# Patient Record
Sex: Male | Born: 1935
Health system: Southern US, Community
[De-identification: ages and names within clinical notes are randomized; demographics above are authoritative.]

## PROBLEM LIST (undated history)

## (undated) DIAGNOSIS — I10 Essential (primary) hypertension: Secondary | ICD-10-CM

## (undated) DIAGNOSIS — B54 Unspecified malaria: Secondary | ICD-10-CM

## (undated) DIAGNOSIS — R011 Cardiac murmur, unspecified: Secondary | ICD-10-CM

## (undated) DIAGNOSIS — I639 Cerebral infarction, unspecified: Secondary | ICD-10-CM

## (undated) DIAGNOSIS — Z87891 Personal history of nicotine dependence: Secondary | ICD-10-CM

## (undated) DIAGNOSIS — I4891 Unspecified atrial fibrillation: Secondary | ICD-10-CM

## (undated) DIAGNOSIS — Z8249 Family history of ischemic heart disease and other diseases of the circulatory system: Secondary | ICD-10-CM

## (undated) DIAGNOSIS — Z227 Latent tuberculosis: Secondary | ICD-10-CM

## (undated) DIAGNOSIS — M1990 Unspecified osteoarthritis, unspecified site: Secondary | ICD-10-CM

## (undated) HISTORY — DX: Unspecified osteoarthritis, unspecified site: M19.90

## (undated) HISTORY — PX: FRACTURE SURGERY: SHX138

## (undated) HISTORY — DX: Latent tuberculosis: Z22.7

## (undated) HISTORY — DX: Personal history of nicotine dependence: Z87.891

## (undated) HISTORY — DX: Essential (primary) hypertension: I10

## (undated) HISTORY — DX: Unspecified atrial fibrillation: I48.91

## (undated) HISTORY — DX: Unspecified malaria: B54

## (undated) HISTORY — DX: Cerebral infarction, unspecified: I63.9

## (undated) HISTORY — DX: Family history of ischemic heart disease and other diseases of the circulatory system: Z82.49

## (undated) HISTORY — DX: Cardiac murmur, unspecified: R01.1

---

## 1993-07-09 HISTORY — PX: LUMBAR FUSION: SHX111

## 2015-07-10 HISTORY — PX: OTHER SURGICAL HISTORY: SHX169

## 2016-03-09 DIAGNOSIS — I129 Hypertensive chronic kidney disease with stage 1 through stage 4 chronic kidney disease, or unspecified chronic kidney disease: Secondary | ICD-10-CM | POA: Insufficient documentation

## 2016-03-09 DIAGNOSIS — L409 Psoriasis, unspecified: Secondary | ICD-10-CM | POA: Insufficient documentation

## 2016-03-09 DIAGNOSIS — L4050 Arthropathic psoriasis, unspecified: Secondary | ICD-10-CM | POA: Insufficient documentation

## 2016-03-09 DIAGNOSIS — I10 Essential (primary) hypertension: Secondary | ICD-10-CM | POA: Insufficient documentation

## 2016-03-09 DIAGNOSIS — M170 Bilateral primary osteoarthritis of knee: Secondary | ICD-10-CM | POA: Insufficient documentation

## 2016-06-12 HISTORY — PX: COLONOSCOPY: SHX174

## 2016-10-04 ENCOUNTER — Encounter: Payer: Self-pay | Admitting: Family Medicine

## 2016-10-04 ENCOUNTER — Ambulatory Visit (INDEPENDENT_AMBULATORY_CARE_PROVIDER_SITE_OTHER): Payer: Medicare Other | Admitting: Family Medicine

## 2016-10-04 VITALS — BP 118/76 | HR 59 | Resp 16 | Ht 73.0 in | Wt 252.0 lb

## 2016-10-04 DIAGNOSIS — Z8249 Family history of ischemic heart disease and other diseases of the circulatory system: Secondary | ICD-10-CM | POA: Diagnosis not present

## 2016-10-04 DIAGNOSIS — I482 Chronic atrial fibrillation, unspecified: Secondary | ICD-10-CM | POA: Insufficient documentation

## 2016-10-04 DIAGNOSIS — Z23 Encounter for immunization: Secondary | ICD-10-CM

## 2016-10-04 DIAGNOSIS — I739 Peripheral vascular disease, unspecified: Secondary | ICD-10-CM

## 2016-10-04 DIAGNOSIS — I10 Essential (primary) hypertension: Secondary | ICD-10-CM | POA: Diagnosis not present

## 2016-10-04 DIAGNOSIS — I480 Paroxysmal atrial fibrillation: Secondary | ICD-10-CM

## 2016-10-04 DIAGNOSIS — L409 Psoriasis, unspecified: Secondary | ICD-10-CM

## 2016-10-04 HISTORY — DX: Family history of ischemic heart disease and other diseases of the circulatory system: Z82.49

## 2016-10-04 MED ORDER — ZOSTER VAC RECOMB ADJUVANTED 50 MCG/0.5ML IM SUSR: 0.5000 mL | Freq: Once | INTRAMUSCULAR | 1 refills | Status: AC

## 2016-10-04 MED ORDER — OLMESARTAN MEDOXOMIL 40 MG PO TABS: 40.0000 mg | ORAL_TABLET | Freq: Every day | ORAL | 3 refills | Status: DC

## 2016-10-04 MED ORDER — METOPROLOL SUCCINATE ER 100 MG PO TB24: 100.0000 mg | ORAL_TABLET | Freq: Every day | ORAL | 3 refills | Status: DC

## 2016-10-04 NOTE — Patient Instructions (Signed)
Tdap Vaccine (Tetanus, Diphtheria and Pertussis): What You Need to Know 1. Why get vaccinated? Tetanus, diphtheria and pertussis are very serious diseases. Tdap vaccine can protect us from these diseases. And, Tdap vaccine given to pregnant women can protect newborn babies against pertussis. TETANUS (Lockjaw) is rare in the [place] today. It causes painful muscle tightening and stiffness, usually all over the body.  It can lead to tightening of muscles in the head and neck so you can't open your mouth, swallow, or sometimes even breathe. Tetanus kills about 1 out of 10 people who are infected even after receiving the best medical care.  DIPHTHERIA is also rare in the [place] today. It can cause a thick coating to form in the back of the throat.  It can lead to breathing problems, heart failure, paralysis, and death.  PERTUSSIS (Whooping Cough) causes severe coughing spells, which can cause difficulty breathing, vomiting and disturbed sleep.  It can also lead to weight loss, incontinence, and rib fractures. Up to 2 in 100 adolescents and 5 in 100 adults with pertussis are hospitalized or have complications, which could include pneumonia or death.  These diseases are caused by bacteria. Diphtheria and pertussis are spread from person to person through secretions from coughing or sneezing. Tetanus enters the body through cuts, scratches, or wounds. Before vaccines, as many as 200,000 cases of diphtheria, 200,000 cases of pertussis, and hundreds of cases of tetanus, were reported in the [place] each year. Since vaccination began, reports of cases for tetanus and diphtheria have dropped by about 99% and for pertussis by about 80%. 2. Tdap vaccine Tdap vaccine can protect adolescents and adults from tetanus, diphtheria, and pertussis. One dose of Tdap is routinely given at age 11 or 12. People who did not get Tdap at that age should get it as soon as possible. Tdap is especially  important for healthcare professionals and anyone having close contact with a baby younger than 12 months. Pregnant women should get a dose of Tdap during every pregnancy, to protect the newborn from pertussis. Infants are most at risk for severe, life-threatening complications from pertussis. Another vaccine, called Td, protects against tetanus and diphtheria, but not pertussis. A Td booster should be given every 10 years. Tdap may be given as one of these boosters if you have never gotten Tdap before. Tdap may also be given after a severe cut or burn to prevent tetanus infection. Your doctor or the person giving you the vaccine can give you more information. Tdap may safely be given at the same time as other vaccines. 3. Some people should not get this vaccine  A person who has ever had a life-threatening allergic reaction after a previous dose of any diphtheria, tetanus or pertussis containing vaccine, OR has a severe allergy to any part of this vaccine, should not get Tdap vaccine. Tell the person giving the vaccine about any severe allergies.  Anyone who had coma or long repeated seizures within 7 days after a childhood dose of DTP or DTaP, or a previous dose of Tdap, should not get Tdap, unless a cause other than the vaccine was found. They can still get Td.  Talk to your doctor if you: ? have seizures or another nervous system problem, ? had severe pain or swelling after any vaccine containing diphtheria, tetanus or pertussis, ? ever had a condition called Guillain-Barr Syndrome (GBS), ? aren't feeling well on the day the shot is scheduled. 4. Risks With any medicine, including   vaccines, there is a chance of side effects. These are usually mild and go away on their own. Serious reactions are also possible but are rare. Most people who get Tdap vaccine do not have any problems with it. Mild problems following Tdap: (Did not interfere with activities)  Pain where the shot was given (about  3 in 4 adolescents or 2 in 3 adults)  Redness or swelling where the shot was given (about 1 person in 5)  Mild fever of at least 100.4F (up to about 1 in 25 adolescents or 1 in 100 adults)  Headache (about 3 or 4 people in 10)  Tiredness (about 1 person in 3 or 4)  Nausea, vomiting, diarrhea, stomach ache (up to 1 in 4 adolescents or 1 in 10 adults)  Chills, sore joints (about 1 person in 10)  Body aches (about 1 person in 3 or 4)  Rash, swollen glands (uncommon)  Moderate problems following Tdap: (Interfered with activities, but did not require medical attention)  Pain where the shot was given (up to 1 in 5 or 6)  Redness or swelling where the shot was given (up to about 1 in 16 adolescents or 1 in 12 adults)  Fever over 102F (about 1 in 100 adolescents or 1 in 250 adults)  Headache (about 1 in 7 adolescents or 1 in 10 adults)  Nausea, vomiting, diarrhea, stomach ache (up to 1 or 3 people in 100)  Swelling of the entire arm where the shot was given (up to about 1 in 500).  Severe problems following Tdap: (Unable to perform usual activities; required medical attention)  Swelling, severe pain, bleeding and redness in the arm where the shot was given (rare).  Problems that could happen after any vaccine:  People sometimes faint after a medical procedure, including vaccination. Sitting or lying down for about 15 minutes can help prevent fainting, and injuries caused by a fall. Tell your doctor if you feel dizzy, or have vision changes or ringing in the ears.  Some people get severe pain in the shoulder and have difficulty moving the arm where a shot was given. This happens very rarely.  Any medication can cause a severe allergic reaction. Such reactions from a vaccine are very rare, estimated at fewer than 1 in a million doses, and would happen within a few minutes to a few hours after the vaccination. As with any medicine, there is a very remote chance of a vaccine  causing a serious injury or death. The safety of vaccines is always being monitored. For more information, visit: www.cdc.gov/vaccinesafety/ 5. What if there is a serious problem? What should I look for? Look for anything that concerns you, such as signs of a severe allergic reaction, very high fever, or unusual behavior. Signs of a severe allergic reaction can include hives, swelling of the face and throat, difficulty breathing, a fast heartbeat, dizziness, and weakness. These would usually start a few minutes to a few hours after the vaccination. What should I do?  If you think it is a severe allergic reaction or other emergency that can't wait, call 9-1-1 or get the person to the nearest hospital. Otherwise, call your doctor.  Afterward, the reaction should be reported to the Vaccine Adverse Event Reporting System (VAERS). Your doctor might file this report, or you can do it yourself through the VAERS web site at www.vaers.hhs.gov, or by calling 1-800-822-7967. ? VAERS does not give medical advice. 6. The National Vaccine Injury Compensation Program The National   Vaccine Injury Compensation Program (VICP) is a federal program that was created to compensate people who may have been injured by certain vaccines. Persons who believe they may have been injured by a vaccine can learn about the program and about filing a claim by calling 1-800-338-2382 or visiting the VICP website at www.hrsa.gov/vaccinecompensation. There is a time limit to file a claim for compensation. 7. How can I learn more?  Ask your doctor. He or she can give you the vaccine package insert or suggest other sources of information.  Call your local or state health department.  Contact the Centers for Disease Control and Prevention (CDC): ? Call 1-800-232-4636 (1-800-CDC-INFO) or ? Visit CDC's website at www.cdc.gov/vaccines CDC Tdap Vaccine VIS (09/01/13) This information is not intended to replace advice given to you by your  health care provider. Make sure you discuss any questions you have with your health care provider. Document Released: 12/25/2011 Document Revised: 03/15/2016 Document Reviewed: 03/15/2016 Elsevier Interactive Patient Education  2017 Elsevier Inc.  

## 2016-10-04 NOTE — Progress Notes (Signed)
Date:  10/04/2016   Name:  Daniel Brandt   DOB:  01-Jan-1934   MRN:  [id number]  PCP:  Adline Potter, MD    Chief Complaint: Establish Care and Mass (LEFT HAND 4 DIGIT )   History of Present Illness:  This is a 83 y.o. male seen for initial visit. C/o cyst L 4th finger x 3 months, nonpainful, hx ganglion cysts. S/p B TKR, L one year ago, fish oil helps OA pain. Hx AFib on metoprolol/asa, declines warfarin, not interested in seeing cards.Marland Kitchen HTN on metoprolol/Benicar, well controlled in past. Hx plaque psoriasis, uses clobetasol sparingly. Hx PVD with RLE chronic venous stasis, uses compression stockings prn. Lipids low in past. Father died 42 CHF/CAD, mother died 35 CHF, sister with CHF, sister bipolar, sister with RA. Tetanus status unknown, Prevnar 03/2016, thinks zoster imm years ago. Labs 03/09/16 showed normal chemistries, LDL 84.  Review of Systems:  Review of Systems  Constitutional: Negative for chills and fever.  HENT: Negative for ear pain, sinus pain and trouble swallowing.   Eyes: Negative for pain.  Respiratory: Negative for cough and shortness of breath.   Cardiovascular: Negative for chest pain, palpitations and leg swelling.  Gastrointestinal: Negative for abdominal pain.  Endocrine: Negative for polydipsia and polyuria.  Genitourinary: Negative for difficulty urinating.  Neurological: Negative for syncope and light-headedness.    Patient Active Problem List   Diagnosis Date Noted  . Atrial fibrillation ([place]) 10/04/2016  . PVD (peripheral vascular disease) ([place]) 10/04/2016  . FH: heart failure 10/04/2016  . Essential hypertension 03/09/2016  . Osteoarthritis of both knees 03/09/2016  . Psoriasis 03/09/2016    Prior to Admission medications   Medication Sig Start Date End Date Taking? Authorizing Provider  aspirin (GOODSENSE ASPIRIN) 325 MG tablet Take 325 mg by mouth.   Yes Historical Provider, MD  clobetasol ointment (TEMOVATE) 0.05 % Apply topically. 04/23/16  04/23/17 Yes Historical Provider, MD  olmesartan (BENICAR) 40 MG tablet Take 1 tablet (40 mg total) by mouth daily. 10/04/16  Yes Adline Potter, MD  metoprolol succinate (TOPROL-XL) 100 MG 24 hr tablet Take 1 tablet (100 mg total) by mouth daily. Take with or immediately following a meal. 10/04/16   Adline Potter, MD  Zoster Vac Recomb Adjuvanted [facility]) injection Inject 0.5 mLs into the muscle once. 10/04/16 10/04/16  Adline Potter, MD    No Known Allergies  Past Surgical History:  Procedure Laterality Date  . FRACTURE SURGERY    . KNEE REPLACE Bilateral 2017  . LUMBAR FUSION  1995   L3 L4    Social History  Substance Use Topics  . Smoking status: Former Smoker    Packs/day: 1.00    Types: Cigarettes    Quit date: 10/04/1956  . Smokeless tobacco: Never Used  . Alcohol use 1.2 oz/week    2 Shots of liquor per week     Comment: 65 ML DAY OF SCOTCH    Family History  Problem Relation Age of Onset  . Heart disease Mother   . Heart disease Father   . Gout Sister     Medication list has been reviewed and updated.  Physical Examination: BP 118/76   Pulse (!) 59   Resp 16   Ht 6\' 1"  (1.854 m)   Wt 252 lb (114.3 kg)   SpO2 98%   BMI 33.25 kg/m   Physical Exam  Constitutional: He is oriented to person, place, and time. He appears well-developed and well-nourished.  HENT:  Head: Normocephalic and atraumatic.  Right Ear: External ear normal.  Left Ear: External ear normal.  Nose: Nose normal.  Mouth/Throat: Oropharynx is clear and moist.  TMs clear  Eyes: Conjunctivae and EOM are normal. Pupils are equal, round, and reactive to light.  Neck: Neck supple. No thyromegaly present.  Cardiovascular: Normal rate, regular rhythm, normal heart sounds and intact distal pulses.   Mild CVS changes RLE  Pulmonary/Chest: Effort normal and breath sounds normal.  Abdominal: Soft. He exhibits no distension and no mass. There is no tenderness.  Musculoskeletal:  Trace edema RLE   Lymphadenopathy:    He has no cervical adenopathy.  Neurological: He is alert and oriented to person, place, and time. Coordination normal.  Romberg neg, gait normal  Skin: Skin is warm and dry.  Psychiatric: He has a normal mood and affect. His behavior is normal.  Nursing note and vitals reviewed.   Assessment and Plan:  1. Paroxysmal atrial fibrillation (HCC) Well controlled on metoprolol/asa, declines warfarin, change metoprolol to XL 100 mg daily, consider decreasing asa to 81 mg daily next visit  2. Essential hypertension Well controlled on metoprolol/Benicar, refill Benicar, may be able to d/c in future  3. PVD (peripheral vascular disease) (HCC) Stable, cont prn compression stocking  4. Psoriasis Well controlled on prn clobetasol  5. FH: heart failure  6. Need for diphtheria-tetanus-pertussis (Tdap) vaccine - Tdap vaccine greater than or equal to 7yo IM  7. Need for zoster vaccination - Zoster Vac Recomb Adjuvanted [facility]) injection; Inject 0.5 mLs into the muscle once.  Dispense: 0.5 mL; Refill: 1  8. Med review Consider d/c vit E next visit (states fish oil helps OA pain)  Return in about 6 months (around 04/06/2017).   45 mins spent with patient, over half in counseling.  [name]. [facility]  10/04/2016

## 2016-11-27 ENCOUNTER — Ambulatory Visit (INDEPENDENT_AMBULATORY_CARE_PROVIDER_SITE_OTHER): Payer: Medicare Other | Admitting: Family Medicine

## 2016-11-27 ENCOUNTER — Encounter: Payer: Self-pay | Admitting: Family Medicine

## 2016-11-27 VITALS — BP 126/82 | HR 62 | Resp 16 | Ht 73.0 in | Wt 250.0 lb

## 2016-11-27 DIAGNOSIS — I10 Essential (primary) hypertension: Secondary | ICD-10-CM

## 2016-11-27 DIAGNOSIS — I739 Peripheral vascular disease, unspecified: Secondary | ICD-10-CM

## 2016-11-27 DIAGNOSIS — M25842 Other specified joint disorders, left hand: Secondary | ICD-10-CM

## 2016-11-27 DIAGNOSIS — I480 Paroxysmal atrial fibrillation: Secondary | ICD-10-CM | POA: Diagnosis not present

## 2016-11-27 MED ORDER — ASPIRIN 81 MG PO TABS: 81.0000 mg | ORAL_TABLET | Freq: Every day | ORAL | Status: AC

## 2016-11-28 NOTE — Progress Notes (Signed)
Date:  11/27/2016   Name:  Daniel Brandt   DOB:  1934-05-31   MRN:  [id number]  PCP:  Adline Potter, MD    Chief Complaint: Mass (On finger left hand fourth digit much bigger this visit than last )   History of Present Illness:  This is a 83 y.o. male seen for one month f/u. Concerned re: mass over PIP L 4th finger getting larger and interfering with hand use. PAfib sxs stable on metoprolol/asa, declines anticoagulants. Continues to use compression stockings for PVD.   Review of Systems:  Review of Systems  Constitutional: Negative for chills, fatigue and fever.  Respiratory: Negative for cough and shortness of breath.   Cardiovascular: Negative for chest pain and palpitations.  Genitourinary: Negative for difficulty urinating.  Neurological: Negative for syncope and light-headedness.    Patient Active Problem List   Diagnosis Date Noted  . Atrial fibrillation ([place]) 10/04/2016  . PVD (peripheral vascular disease) ([place]) 10/04/2016  . FH: heart failure 10/04/2016  . Essential hypertension 03/09/2016  . Osteoarthritis of both knees 03/09/2016  . Psoriasis 03/09/2016    Prior to Admission medications   Medication Sig Start Date End Date Taking? Authorizing Provider  clobetasol ointment (TEMOVATE) 0.05 % Apply topically. 04/23/16 04/23/17 Yes [provider]  metoprolol succinate (TOPROL-XL) 100 MG 24 hr tablet Take 1 tablet (100 mg total) by mouth daily. Take with or immediately following a meal. 10/04/16  Yes Shawne Bulow, Gwyndolyn Saxon, MD  Multiple Vitamins-Minerals (MULTIVITAMIN WITH MINERALS) tablet Take 1 tablet by mouth daily.   Yes [provider]  olmesartan (BENICAR) 40 MG tablet Take 1 tablet (40 mg total) by mouth daily. 10/04/16  Yes Maddie Brazier, Gwyndolyn Saxon, MD  Omega-3 Fatty Acids (FISH OIL) 1000 MG CAPS Take 2 capsules by mouth 2 (two) times daily.   Yes [provider]  aspirin 81 MG tablet Take 1 tablet (81 mg total) by mouth daily. 11/27/16   Adline Potter,  MD    No Known Allergies  Past Surgical History:  Procedure Laterality Date  . FRACTURE SURGERY    . KNEE REPLACE Bilateral 2017  . LUMBAR FUSION  1995   L3 L4    Social History  Substance Use Topics  . Smoking status: Former Smoker    Packs/day: 1.00    Types: Cigarettes    Quit date: 10/04/1956  . Smokeless tobacco: Never Used  . Alcohol use 1.2 oz/week    2 Shots of liquor per week     Comment: 59 ML DAY OF SCOTCH    Family History  Problem Relation Age of Onset  . Heart disease Mother   . Heart disease Father   . Gout Sister     Medication list has been reviewed and updated.  Physical Examination: BP 126/82   Pulse 62   Resp 16   Ht 6\' 1"  (1.854 m)   Wt 250 lb (113.4 kg)   SpO2 97%   BMI 32.98 kg/m   Physical Exam  Constitutional: He appears well-developed and well-nourished.  Cardiovascular: Normal rate, regular rhythm and normal heart sounds.   Pulmonary/Chest: Effort normal and breath sounds normal.  Musculoskeletal:  Cystic lesion 2x2 cm over dorsal PIP L 4th digit Trace BLE edema  Neurological: He is alert.  Skin: Skin is warm and dry.  Psychiatric: He has a normal mood and affect. His behavior is normal.  Nursing note and vitals reviewed.   Assessment and Plan:  1. Mass of joint of left hand Likely ganglion cyst,  enlarging and affecting hand use - Ambulatory referral to Orthopedic Surgery  2. Paroxysmal atrial fibrillation (HCC) Stable on metoprolol, may decrease asa to 81 mg daily, declines anticoagulants  3. Essential hypertension Well controlled on metoprolol/Benicar  4. PVD (peripheral vascular disease) (HCC) Stable, cont asa/comprssion stockings  5. Med review D/c vitamin E  Return in about 6 months (around 05/30/2017).  [name]. [facility]  11/28/2016

## 2016-12-04 DIAGNOSIS — M67442 Ganglion, left hand: Secondary | ICD-10-CM | POA: Insufficient documentation

## 2017-04-01 ENCOUNTER — Ambulatory Visit (INDEPENDENT_AMBULATORY_CARE_PROVIDER_SITE_OTHER): Payer: Medicare Other

## 2017-04-01 VITALS — BP 136/70 | HR 88 | Temp 97.7°F | Resp 16 | Ht 73.0 in | Wt 261.0 lb

## 2017-04-01 DIAGNOSIS — Z0000 Encounter for general adult medical examination without abnormal findings: Secondary | ICD-10-CM

## 2017-04-01 DIAGNOSIS — Z23 Encounter for immunization: Secondary | ICD-10-CM | POA: Diagnosis not present

## 2017-04-01 NOTE — Progress Notes (Signed)
Subjective:   Daniel Brandt is a 83 y.o. male who presents for Medicare Annual/Subsequent preventive examination.  Review of Systems:  Cardiac Risk Factors include: advanced age (>32men, >24 women);male gender;obesity (BMI >30kg/m2);hypertension;smoking/ tobacco exposure     Objective:    Vitals: BP 136/70 (BP Location: Left Arm, Patient Position: Sitting)   Pulse 88   Temp 97.7 F (36.5 C)   Resp 16   Ht 6\' 1"  (1.854 m)   Wt 261 lb (118.4 kg)   BMI 34.43 kg/m   Body mass index is 34.43 kg/m.  Tobacco History  Smoking Status  . Former Smoker  . Packs/day: 1.00  . Types: Cigarettes  . Quit date: 10/04/1956  Smokeless Tobacco  . Never Used     Counseling given: Not Answered   Past Medical History:  Diagnosis Date  . A-fib ([place])   . Arthritis   . Former smoker   . Hypertension   . Malaria   . Stroke [facility])    Past Surgical History:  Procedure Laterality Date  . FRACTURE SURGERY    . KNEE REPLACE Bilateral 2017  . LUMBAR FUSION  1995   L3 L4   Family History  Problem Relation Age of Onset  . Heart disease Mother   . Heart disease Father   . Gout Sister   . Heart attack Son    History  Sexual Activity  . Sexual activity: Not on file    Outpatient Encounter Prescriptions as of 04/01/2017  Medication Sig  . aspirin 81 MG tablet Take 1 tablet (81 mg total) by mouth daily.  . clobetasol ointment (TEMOVATE) 0.05 % Apply topically.  . metoprolol succinate (TOPROL-XL) 100 MG 24 hr tablet Take 1 tablet (100 mg total) by mouth daily. Take with or immediately following a meal.  . Multiple Vitamins-Minerals (MULTIVITAMIN WITH MINERALS) tablet Take 1 tablet by mouth daily.  Marland Kitchen olmesartan (BENICAR) 40 MG tablet Take 1 tablet (40 mg total) by mouth daily.  . Omega-3 Fatty Acids (FISH OIL) 1000 MG CAPS Take 2 capsules by mouth 2 (two) times daily.   No facility-administered encounter medications on file as of 04/01/2017.     Activities of Daily Living In your  present state of health, do you have any difficulty performing the following activities: 04/01/2017 10/04/2016  Hearing? Tempie Donning  Vision? Y N  Difficulty concentrating or making decisions? Y N  Walking or climbing stairs? N N  Comment improved since knee surgery -  Dressing or bathing? N N  Doing errands, shopping? N N  Preparing Food and eating ? N -  Using the Toilet? N -  In the past six months, have you accidently leaked urine? N -  Do you have problems with loss of bowel control? N -  Managing your Medications? N -  Managing your Finances? N -  Housekeeping or managing your Housekeeping? N -    Patient Care Team: Adline Potter, MD as PCP - General (Family Medicine)   Assessment:     Exercise Activities and Dietary recommendations Current Exercise Habits: The patient does not participate in regular exercise at present, Exercise limited by: orthopedic condition(s) (knee replacements/ arthritis)  Goals    . Increase water intake          Recommend increasing fluid intake to 5-6 glasses throughout entire day      Fall Risk Fall Risk  04/01/2017 10/04/2016  Falls in the past year? No No   Depression Screen PHQ 2/9 Scores 04/01/2017  10/04/2016  PHQ - 2 Score 0 0    Cognitive Function     6CIT Screen 04/01/2017  What Year? 0 points  What month? 0 points  What time? 0 points  Count back from 20 0 points  Months in reverse 0 points  Repeat phrase 0 points  Total Score 0    Immunization History  Administered Date(s) Administered  . Influenza, High Dose Seasonal PF 04/01/2017  . Influenza,inj,Quad PF,6+ Mos 03/09/2016  . Pneumococcal Conjugate-13 03/09/2016  . Tdap 10/04/2016  . Zoster Recombinat (Shingrix) 08/20/2016   Screening Tests Health Maintenance  Topic Date Due  . TETANUS/TDAP  10/05/2026  . INFLUENZA VACCINE  Completed  . PNA vac Low Risk Adult  Completed      Plan:    I have personally reviewed and addressed the Medicare Annual Wellness questionnaire  and have noted the following in the patient's chart:  A. Medical and social history B. Use of alcohol, tobacco or illicit drugs  C. Current medications and supplements D. Functional ability and status E.  Nutritional status F.  Physical activity G. Advance directives H. List of other physicians I.  Hospitalizations, surgeries, and ER visits in previous 12 months J.  [place] such as hearing and vision if needed, cognitive and depression L. Referrals and appointments   In addition, I have reviewed and discussed with patient certain preventive protocols, quality metrics, and best practice recommendations. A written personalized care plan for preventive services as well as general preventive health recommendations were provided to patient.   Signed,  Tyler Aas, LPN Nurse Health Advisor   MD Recommendations: none

## 2017-04-01 NOTE — Patient Instructions (Addendum)
[name] , Thank you for taking time to come for your Medicare Wellness Visit. I appreciate your ongoing commitment to your health goals. Please review the following plan we discussed and let me know if I can assist you in the future.   Screening recommendations/referrals: Colonoscopy: no longer required Recommended yearly ophthalmology/optometry visit for glaucoma screening and checkup Recommended yearly dental visit for hygiene and checkup  Vaccinations: Influenza vaccine: done today Pneumococcal vaccine: check your immunization record for pneumovax 23 Tdap vaccine: up to date Shingles vaccine: up to date   Advanced directives: Please bring a copy of your health care power of attorney and living will to the office at your convenience.  Conditions/risks identified: Recommend increasing fluid intake to 5-6 glasses throughout entire day  Next appointment: Follow up on 06/04/2017 at 9:00am Dr.Plonk. Follow up in one year for your annual wellness exam.   Preventive Care 65 Years and Older, Male Preventive care refers to lifestyle choices and visits with your health care provider that can promote health and wellness. What does preventive care include?  A yearly physical exam. This is also called an annual well check.  Dental exams once or twice a year.  Routine eye exams. Ask your health care provider how often you should have your eyes checked.  Personal lifestyle choices, including:  Daily care of your teeth and gums.  Regular physical activity.  Eating a healthy diet.  Avoiding tobacco and drug use.  Limiting alcohol use.  Practicing safe sex.  Taking low doses of aspirin every day.  Taking vitamin and mineral supplements as recommended by your health care provider. What happens during an annual well check? The services and screenings done by your health care provider during your annual well check will depend on your age, overall health, lifestyle risk factors, and  family history of disease. Counseling  Your health care provider may ask you questions about your:  Alcohol use.  Tobacco use.  Drug use.  Emotional well-being.  Home and relationship well-being.  Sexual activity.  Eating habits.  History of falls.  Memory and ability to understand (cognition).  Work and work Statistician. Screening  You may have the following tests or measurements:  Height, weight, and BMI.  Blood pressure.  Lipid and cholesterol levels. These may be checked every 5 years, or more frequently if you are over 40 years old.  Skin check.  Lung cancer screening. You may have this screening every year starting at age 78 if you have a 30-pack-year history of smoking and currently smoke or have quit within the past 15 years.  Fecal occult blood test (FOBT) of the stool. You may have this test every year starting at age 98.  Flexible sigmoidoscopy or colonoscopy. You may have a sigmoidoscopy every 5 years or a colonoscopy every 10 years starting at age 22.  Prostate cancer screening. Recommendations will vary depending on your family history and other risks.  Hepatitis C blood test.  Hepatitis B blood test.  Sexually transmitted disease (STD) testing.  Diabetes screening. This is done by checking your blood sugar (glucose) after you have not eaten for a while (fasting). You may have this done every 1-3 years.  Abdominal aortic aneurysm (AAA) screening. You may need this if you are a current or former smoker.  Osteoporosis. You may be screened starting at age 13 if you are at high risk. Talk with your health care provider about your test results, treatment options, and if necessary, the need for more  tests. Vaccines  Your health care provider may recommend certain vaccines, such as:  Influenza vaccine. This is recommended every year.  Tetanus, diphtheria, and acellular pertussis (Tdap, Td) vaccine. You may need a Td booster every 10 years.  Zoster  vaccine. You may need this after age 82.  Pneumococcal 13-valent conjugate (PCV13) vaccine. One dose is recommended after age 26.  Pneumococcal polysaccharide (PPSV23) vaccine. One dose is recommended after age 35. Talk to your health care provider about which screenings and vaccines you need and how often you need them. This information is not intended to replace advice given to you by your health care provider. Make sure you discuss any questions you have with your health care provider. Document Released: 07/22/2015 Document Revised: 03/14/2016 Document Reviewed: 04/26/2015 Elsevier Interactive Patient Education  2017 [place] Prevention in the Home Falls can cause injuries. They can happen to people of all ages. There are many things you can do to make your home safe and to help prevent falls. What can I do on the outside of my home?  Regularly fix the edges of walkways and driveways and fix any cracks.  Remove anything that might make you trip as you walk through a door, such as a raised step or threshold.  Trim any bushes or trees on the path to your home.  Use bright outdoor lighting.  Clear any walking paths of anything that might make someone trip, such as rocks or tools.  Regularly check to see if handrails are loose or broken. Make sure that both sides of any steps have handrails.  Any raised decks and porches should have guardrails on the edges.  Have any leaves, snow, or ice cleared regularly.  Use sand or salt on walking paths during winter.  Clean up any spills in your garage right away. This includes oil or grease spills. What can I do in the bathroom?  Use night lights.  Install grab bars by the toilet and in the tub and shower. Do not use towel bars as grab bars.  Use non-skid mats or decals in the tub or shower.  If you need to sit down in the shower, use a plastic, non-slip stool.  Keep the floor dry. Clean up any water that spills on the  floor as soon as it happens.  Remove soap buildup in the tub or shower regularly.  Attach bath mats securely with double-sided non-slip rug tape.  Do not have throw rugs and other things on the floor that can make you trip. What can I do in the bedroom?  Use night lights.  Make sure that you have a light by your bed that is easy to reach.  Do not use any sheets or blankets that are too big for your bed. They should not hang down onto the floor.  Have a firm chair that has side arms. You can use this for support while you get dressed.  Do not have throw rugs and other things on the floor that can make you trip. What can I do in the kitchen?  Clean up any spills right away.  Avoid walking on wet floors.  Keep items that you use a lot in easy-to-reach places.  If you need to reach something above you, use a strong step stool that has a grab bar.  Keep electrical cords out of the way.  Do not use floor polish or wax that makes floors slippery. If you must use wax, use non-skid floor  wax.  Do not have throw rugs and other things on the floor that can make you trip. What can I do with my stairs?  Do not leave any items on the stairs.  Make sure that there are handrails on both sides of the stairs and use them. Fix handrails that are broken or loose. Make sure that handrails are as long as the stairways.  Check any carpeting to make sure that it is firmly attached to the stairs. Fix any carpet that is loose or worn.  Avoid having throw rugs at the top or bottom of the stairs. If you do have throw rugs, attach them to the floor with carpet tape.  Make sure that you have a light switch at the top of the stairs and the bottom of the stairs. If you do not have them, ask someone to add them for you. What else can I do to help prevent falls?  Wear shoes that:  Do not have high heels.  Have rubber bottoms.  Are comfortable and fit you well.  Are closed at the toe. Do not wear  sandals.  If you use a stepladder:  Make sure that it is fully opened. Do not climb a closed stepladder.  Make sure that both sides of the stepladder are locked into place.  Ask someone to hold it for you, if possible.  Clearly mark and make sure that you can see:  Any grab bars or handrails.  First and last steps.  Where the edge of each step is.  Use tools that help you move around (mobility aids) if they are needed. These include:  Canes.  Walkers.  Scooters.  Crutches.  Turn on the lights when you go into a dark area. Replace any light bulbs as soon as they burn out.  Set up your furniture so you have a clear path. Avoid moving your furniture around.  If any of your floors are uneven, fix them.  If there are any pets around you, be aware of where they are.  Review your medicines with your doctor. Some medicines can make you feel dizzy. This can increase your chance of falling. Ask your doctor what other things that you can do to help prevent falls. This information is not intended to replace advice given to you by your health care provider. Make sure you discuss any questions you have with your health care provider. Document Released: 04/21/2009 Document Revised: 12/01/2015 Document Reviewed: 07/30/2014 Elsevier Interactive Patient Education  2017 [place].  Influenza (Flu) Vaccine (Inactivated or Recombinant): What You Need to Know 1. Why get vaccinated? Influenza ("flu") is a contagious disease that spreads around the [place] every year, usually between October and May. Flu is caused by influenza viruses, and is spread mainly by coughing, sneezing, and close contact. Anyone can get flu. Flu strikes suddenly and can last several days. Symptoms vary by age, but can include:  fever/chills  sore throat  muscle aches  fatigue  cough  headache  runny or stuffy nose  Flu can also lead to pneumonia and blood infections, and cause diarrhea and  seizures in children. If you have a medical condition, such as heart or lung disease, flu can make it worse. Flu is more dangerous for some people. Infants and young children, people 20 years of age and older, pregnant women, and people with certain health conditions or a weakened immune system are at greatest risk. Each year thousands of people in the [place] die  from flu, and many more are hospitalized. Flu vaccine can:  keep you from getting flu,  make flu less severe if you do get it, and  keep you from spreading flu to your family and other people. 2. Inactivated and recombinant flu vaccines A dose of flu vaccine is recommended every flu season. Children 6 months through 34 years of age may need two doses during the same flu season. Everyone else needs only one dose each flu season. Some inactivated flu vaccines contain a very small amount of a mercury-based preservative called thimerosal. Studies have not shown thimerosal in vaccines to be harmful, but flu vaccines that do not contain thimerosal are available. There is no live flu virus in flu shots. They cannot cause the flu. There are many flu viruses, and they are always changing. Each year a new flu vaccine is made to protect against three or four viruses that are likely to cause disease in the upcoming flu season. But even when the vaccine doesn't exactly match these viruses, it may still provide some protection. Flu vaccine cannot prevent:  flu that is caused by a virus not covered by the vaccine, or  illnesses that look like flu but are not.  It takes about 2 weeks for protection to develop after vaccination, and protection lasts through the flu season. 3. Some people should not get this vaccine Tell the person who is giving you the vaccine:  If you have any severe, life-threatening allergies. If you ever had a life-threatening allergic reaction after a dose of flu vaccine, or have a severe allergy to any part of this  vaccine, you may be advised not to get vaccinated. Most, but not all, types of flu vaccine contain a small amount of egg protein.  If you ever had Guillain-Barr Syndrome (also called GBS). Some people with a history of GBS should not get this vaccine. This should be discussed with your doctor.  If you are not feeling well. It is usually okay to get flu vaccine when you have a mild illness, but you might be asked to come back when you feel better.  4. Risks of a vaccine reaction With any medicine, including vaccines, there is a chance of reactions. These are usually mild and go away on their own, but serious reactions are also possible. Most people who get a flu shot do not have any problems with it. Minor problems following a flu shot include:  soreness, redness, or swelling where the shot was given  hoarseness  sore, red or itchy eyes  cough  fever  aches  headache  itching  fatigue  If these problems occur, they usually begin soon after the shot and last 1 or 2 days. More serious problems following a flu shot can include the following:  There may be a small increased risk of Guillain-Barre Syndrome (GBS) after inactivated flu vaccine. This risk has been estimated at 1 or 2 additional cases per million people vaccinated. This is much lower than the risk of severe complications from flu, which can be prevented by flu vaccine.  Young children who get the flu shot along with pneumococcal vaccine (PCV13) and/or DTaP vaccine at the same time might be slightly more likely to have a seizure caused by fever. Ask your doctor for more information. Tell your doctor if a child who is getting flu vaccine has ever had a seizure.  Problems that could happen after any injected vaccine:  People sometimes faint after a medical procedure, including  vaccination. Sitting or lying down for about 15 minutes can help prevent fainting, and injuries caused by a fall. Tell your doctor if you feel dizzy,  or have vision changes or ringing in the ears.  Some people get severe pain in the shoulder and have difficulty moving the arm where a shot was given. This happens very rarely.  Any medication can cause a severe allergic reaction. Such reactions from a vaccine are very rare, estimated at about 1 in a million doses, and would happen within a few minutes to a few hours after the vaccination. As with any medicine, there is a very remote chance of a vaccine causing a serious injury or death. The safety of vaccines is always being monitored. For more information, visit: http://www.aguilar.org/ 5. What if there is a serious reaction? What should I look for? Look for anything that concerns you, such as signs of a severe allergic reaction, very high fever, or unusual behavior. Signs of a severe allergic reaction can include hives, swelling of the face and throat, difficulty breathing, a fast heartbeat, dizziness, and weakness. These would start a few minutes to a few hours after the vaccination. What should I do?  If you think it is a severe allergic reaction or other emergency that can't wait, call 9-1-1 and get the person to the nearest hospital. Otherwise, call your doctor.  Reactions should be reported to the Vaccine Adverse Event Reporting System (VAERS). Your doctor should file this report, or you can do it yourself through the VAERS web site at www.vaers.SamedayNews.es, or by calling 806 553 5523. ? VAERS does not give medical advice. 6. The National Vaccine Injury Compensation Program The [employer] Vaccine Injury Compensation Program (VICP) is a federal program that was created to compensate people who may have been injured by certain vaccines. Persons who believe they may have been injured by a vaccine can learn about the program and about filing a claim by calling 2516646597 or visiting the [place] website at GoldCloset.com.ee. There is a time limit to file a claim for  compensation. 7. How can I learn more?  Ask your healthcare provider. He or she can give you the vaccine package insert or suggest other sources of information.  Call your local or state health department.  Contact the Centers for Disease Control and Prevention (CDC): ? Call 567-590-2636 (1-800-CDC-INFO) or ? Visit CDC's website at https://gibson.com/ Vaccine Information Statement, Inactivated Influenza Vaccine (02/12/2014) This information is not intended to replace advice given to you by your health care provider. Make sure you discuss any questions you have with your health care provider. Document Released: 04/19/2006 Document Revised: 03/15/2016 Document Reviewed: 03/15/2016 Elsevier Interactive Patient Education  2017 [employer].

## 2017-04-05 ENCOUNTER — Ambulatory Visit: Payer: Medicare Other | Admitting: Family Medicine

## 2017-06-04 ENCOUNTER — Ambulatory Visit: Payer: Medicare Other | Admitting: Family Medicine

## 2017-09-09 ENCOUNTER — Other Ambulatory Visit: Payer: Self-pay | Admitting: Family Medicine

## 2017-10-09 ENCOUNTER — Ambulatory Visit (INDEPENDENT_AMBULATORY_CARE_PROVIDER_SITE_OTHER): Payer: Medicare Other | Admitting: Family Medicine

## 2017-10-09 ENCOUNTER — Encounter: Payer: Self-pay | Admitting: Family Medicine

## 2017-10-09 VITALS — BP 117/78 | HR 84 | Resp 16 | Ht 73.0 in | Wt 258.0 lb

## 2017-10-09 DIAGNOSIS — I10 Essential (primary) hypertension: Secondary | ICD-10-CM | POA: Diagnosis not present

## 2017-10-09 DIAGNOSIS — Z9989 Dependence on other enabling machines and devices: Secondary | ICD-10-CM

## 2017-10-09 DIAGNOSIS — M170 Bilateral primary osteoarthritis of knee: Secondary | ICD-10-CM

## 2017-10-09 DIAGNOSIS — Z23 Encounter for immunization: Secondary | ICD-10-CM | POA: Diagnosis not present

## 2017-10-09 DIAGNOSIS — I480 Paroxysmal atrial fibrillation: Secondary | ICD-10-CM

## 2017-10-09 DIAGNOSIS — E669 Obesity, unspecified: Secondary | ICD-10-CM

## 2017-10-09 DIAGNOSIS — G4733 Obstructive sleep apnea (adult) (pediatric): Secondary | ICD-10-CM | POA: Insufficient documentation

## 2017-10-09 DIAGNOSIS — I739 Peripheral vascular disease, unspecified: Secondary | ICD-10-CM | POA: Diagnosis not present

## 2017-10-09 NOTE — Progress Notes (Signed)
Date:  10/09/2017   Name:  Daniel Brandt   DOB:  08-13-1933   MRN:  [id number]  PCP:  Adline Potter, MD    Chief Complaint: Annual Exam and Allergies   History of Present Illness:  This is a 84 y.o. male seen for 11 month f/u. Ganglion cysts treated by ortho. Pafib well controlled with only occ palpitations on metoprolol/asa. PVD stable with minimal BLE edema. OSA on CPAP nightly. Weight up 8#. Seasonal AR worse now, OTC Claritin ineffective. Still feels fish oil helping knees.  Review of Systems:  Review of Systems  Constitutional: Negative for chills and fever.  Respiratory: Negative for cough and shortness of breath.   Cardiovascular: Negative for chest pain.  Genitourinary: Negative for difficulty urinating.  Neurological: Negative for syncope and light-headedness.    Patient Active Problem List   Diagnosis Date Noted  . Ganglion cyst of finger of left hand 12/04/2016  . Obesity (BMI 30-39.9) 12/04/2016  . Atrial fibrillation ([place]) 10/04/2016  . PVD (peripheral vascular disease) ([place]) 10/04/2016  . FH: heart failure 10/04/2016  . Essential hypertension 03/09/2016  . Osteoarthritis of both knees 03/09/2016  . Psoriasis 03/09/2016    Prior to Admission medications   Medication Sig Start Date End Date Taking? Authorizing Provider  aspirin 81 MG tablet Take 1 tablet (81 mg total) by mouth daily. 11/27/16  Yes Susanne Baumgarner, Gwyndolyn Saxon, MD  metoprolol succinate (TOPROL-XL) 100 MG 24 hr tablet Take 100 mg by mouth daily. Take with or immediately following a meal.   Yes [provider]  Multiple Vitamins-Minerals (MULTIVITAMIN WITH MINERALS) tablet Take 1 tablet by mouth daily.   Yes [provider]  olmesartan (BENICAR) 40 MG tablet Take 1 tablet (40 mg total) by mouth daily. 10/04/16  Yes Terisa Belardo, Gwyndolyn Saxon, MD  Omega-3 Fatty Acids (FISH OIL) 1000 MG CAPS Take 2 capsules by mouth 2 (two) times daily.   Yes [provider]    No Known Allergies  Past Surgical  History:  Procedure Laterality Date  . FRACTURE SURGERY    . KNEE REPLACE Bilateral 2017  . LUMBAR FUSION  1995   L3 L4    Social History   Tobacco Use  . Smoking status: Former Smoker    Packs/day: 1.00    Types: Cigarettes    Last attempt to quit: 10/04/1956    Years since quitting: 61.0  . Smokeless tobacco: Never Used  Substance Use Topics  . Alcohol use: Yes    Alcohol/week: 1.2 oz    Types: 2 Shots of liquor per week    Comment: 50 ML DAY OF SCOTCH  . Drug use: No    Family History  Problem Relation Age of Onset  . Heart disease Mother   . Heart disease Father   . Gout Sister   . Heart attack Son     Medication list has been reviewed and updated.  Physical Examination: BP 117/78   Pulse 84   Resp 16   Ht 6\' 1"  (1.854 m)   Wt 258 lb (117 kg)   SpO2 97%   BMI 34.04 kg/m   Physical Exam  Constitutional: He appears well-developed and well-nourished.  Cardiovascular: Normal rate, regular rhythm and normal heart sounds.  Pulmonary/Chest: Effort normal and breath sounds normal.  Musculoskeletal:  Trace BLE edema  Neurological: He is alert.  Skin: Skin is warm and dry.  Psychiatric: He has a normal mood and affect. His behavior is normal.  Nursing note and vitals reviewed.  Assessment and Plan:  1. Paroxysmal atrial fibrillation (HCC) Well controlled on metoprolol/asa, declines anticoagulation  2. PVD (peripheral vascular disease) (HCC) Stable on asa/comp stockings, LDL 84  3. Essential hypertension Well controlled on metoprolol/Benicar  4. Primary osteoarthritis of both knees Advised weight loss, ok to continue fish oil as feels helping  5. Obesity (BMI 30-39.9) Weight up 8#, exercise/weight loss discussed  6. OSA on CPAP Encouraged continued use  7. Need for pneumococcal vaccination - Pneumococcal polysaccharide vaccine 23-valent greater than or equal to 2yo subcutaneous/IM  Return in about 6 months (around 04/10/2018).  [name].  [facility]  10/09/2017

## 2017-10-23 ENCOUNTER — Other Ambulatory Visit: Payer: Self-pay | Admitting: Family Medicine

## 2018-01-14 ENCOUNTER — Telehealth: Payer: Self-pay | Admitting: Internal Medicine

## 2018-01-14 NOTE — Telephone Encounter (Signed)
Called to schedule Medicare Annual Wellness Visit with Nurse Health Advisor. If patient returns call, please note: their last AWV was on 9/24 /18 please schedule AWV with NHA any date after 9/24/ 2019  Thank you! For any questions please contact: Jill Alexanders [phone]  Or Skype me at: [email or web address]

## 2018-02-17 ENCOUNTER — Telehealth: Payer: Self-pay

## 2018-02-17 NOTE — Telephone Encounter (Signed)
Called to schedule Medicare Annual Wellness Visit with the Nurse Health Advisor.   If patient returns call, please note: their last AWV was on 9 /24 /18 please schedule AWV with NHA any date after sept 24  2019  Thank you! For any questions please contact: Jill Alexanders [phone] or Skype at: [email or web address]

## 2018-04-02 ENCOUNTER — Ambulatory Visit (INDEPENDENT_AMBULATORY_CARE_PROVIDER_SITE_OTHER): Payer: Medicare Other

## 2018-04-02 VITALS — BP 112/68 | HR 60 | Temp 97.6°F | Ht 73.0 in | Wt 261.2 lb

## 2018-04-02 DIAGNOSIS — Z0000 Encounter for general adult medical examination without abnormal findings: Secondary | ICD-10-CM | POA: Diagnosis not present

## 2018-04-02 DIAGNOSIS — Z135 Encounter for screening for eye and ear disorders: Secondary | ICD-10-CM

## 2018-04-02 DIAGNOSIS — Z23 Encounter for immunization: Secondary | ICD-10-CM

## 2018-04-02 NOTE — Patient Instructions (Signed)
Daniel Brandt , Thank you for taking time to come for your Medicare Wellness Visit. I appreciate your ongoing commitment to your health goals. Please review the following plan we discussed and let me know if I can assist you in the future.   Screening recommendations/referrals: Colorectal Screening: No longer required  Vision and Dental Exams: Recommended annual ophthalmology exams for early detection of glaucoma and other disorders of the eye Recommended annual dental exams for proper oral hygiene  Vaccinations: Influenza vaccine: Completed today Pneumococcal vaccine: Up to date Tdap vaccine: Up to date Shingles vaccine: Please call your insurance company to determine your out of pocket expense for the Shingrix vaccine. You may receive this vaccine at your local pharmacy.  Advanced directives: Please bring a copy of your POA (Power of Attorney) and/or Living Will to your next appointment.  Goals: Recommend to remove any items from the home that may cause slips or trips.  Next appointment: Please schedule your Annual Wellness Visit with your Nurse Health Advisor in one year.  Preventive Care 35 Years and Older, Male Preventive care refers to lifestyle choices and visits with your health care provider that can promote health and wellness. What does preventive care include?  A yearly physical exam. This is also called an annual well check.  Dental exams once or twice a year.  Routine eye exams. Ask your health care provider how often you should have your eyes checked.  Personal lifestyle choices, including:  Daily care of your teeth and gums.  Regular physical activity.  Eating a healthy diet.  Avoiding tobacco and drug use.  Limiting alcohol use.  Practicing safe sex.  Taking low doses of aspirin every day if recommended by your health care provider..  Taking vitamin and mineral supplements as recommended by your health care provider. What happens during an annual well  check? The services and screenings done by your health care provider during your annual well check will depend on your age, overall health, lifestyle risk factors, and family history of disease. Counseling  Your health care provider may ask you questions about your:  Alcohol use.  Tobacco use.  Drug use.  Emotional well-being.  Home and relationship well-being.  Sexual activity.  Eating habits.  History of falls.  Memory and ability to understand (cognition).  Work and work Statistician. Screening  You may have the following tests or measurements:  Height, weight, and BMI.  Blood pressure.  Lipid and cholesterol levels. These may be checked every 5 years, or more frequently if you are over 70 years old.  Skin check.  Lung cancer screening. You may have this screening every year starting at age 32 if you have a 30-pack-year history of smoking and currently smoke or have quit within the past 15 years.  Fecal occult blood test (FOBT) of the stool. You may have this test every year starting at age 84.  Flexible sigmoidoscopy or colonoscopy. You may have a sigmoidoscopy every 5 years or a colonoscopy every 10 years starting at age 42.  Prostate cancer screening. Recommendations will vary depending on your family history and other risks.  Hepatitis C blood test.  Hepatitis B blood test.  Sexually transmitted disease (STD) testing.  Diabetes screening. This is done by checking your blood sugar (glucose) after you have not eaten for a while (fasting). You may have this done every 1-3 years.  Abdominal aortic aneurysm (AAA) screening. You may need this if you are a current or former smoker.  Osteoporosis. You  may be screened starting at age 68 if you are at high risk. Talk with your health care provider about your test results, treatment options, and if necessary, the need for more tests. Vaccines  Your health care provider may recommend certain vaccines, such  as:  Influenza vaccine. This is recommended every year.  Tetanus, diphtheria, and acellular pertussis (Tdap, Td) vaccine. You may need a Td booster every 10 years.  Zoster vaccine. You may need this after age 46.  Pneumococcal 13-valent conjugate (PCV13) vaccine. One dose is recommended after age 66.  Pneumococcal polysaccharide (PPSV23) vaccine. One dose is recommended after age 67. Talk to your health care provider about which screenings and vaccines you need and how often you need them. This information is not intended to replace advice given to you by your health care provider. Make sure you discuss any questions you have with your health care provider. Document Released: 07/22/2015 Document Revised: 03/14/2016 Document Reviewed: 04/26/2015 Elsevier Interactive Patient Education  2017 [place] Prevention in the Home Falls can cause injuries. They can happen to people of all ages. There are many things you can do to make your home safe and to help prevent falls. What can I do on the outside of my home?  Regularly fix the edges of walkways and driveways and fix any cracks.  Remove anything that might make you trip as you walk through a door, such as a raised step or threshold.  Trim any bushes or trees on the path to your home.  Use bright outdoor lighting.  Clear any walking paths of anything that might make someone trip, such as rocks or tools.  Regularly check to see if handrails are loose or broken. Make sure that both sides of any steps have handrails.  Any raised decks and porches should have guardrails on the edges.  Have any leaves, snow, or ice cleared regularly.  Use sand or salt on walking paths during winter.  Clean up any spills in your garage right away. This includes oil or grease spills. What can I do in the bathroom?  Use night lights.  Install grab bars by the toilet and in the tub and shower. Do not use towel bars as grab bars.  Use  non-skid mats or decals in the tub or shower.  If you need to sit down in the shower, use a plastic, non-slip stool.  Keep the floor dry. Clean up any water that spills on the floor as soon as it happens.  Remove soap buildup in the tub or shower regularly.  Attach bath mats securely with double-sided non-slip rug tape.  Do not have throw rugs and other things on the floor that can make you trip. What can I do in the bedroom?  Use night lights.  Make sure that you have a light by your bed that is easy to reach.  Do not use any sheets or blankets that are too big for your bed. They should not hang down onto the floor.  Have a firm chair that has side arms. You can use this for support while you get dressed.  Do not have throw rugs and other things on the floor that can make you trip. What can I do in the kitchen?  Clean up any spills right away.  Avoid walking on wet floors.  Keep items that you use a lot in easy-to-reach places.  If you need to reach something above you, use a strong step stool that  has a grab bar.  Keep electrical cords out of the way.  Do not use floor polish or wax that makes floors slippery. If you must use wax, use non-skid floor wax.  Do not have throw rugs and other things on the floor that can make you trip. What can I do with my stairs?  Do not leave any items on the stairs.  Make sure that there are handrails on both sides of the stairs and use them. Fix handrails that are broken or loose. Make sure that handrails are as long as the stairways.  Check any carpeting to make sure that it is firmly attached to the stairs. Fix any carpet that is loose or worn.  Avoid having throw rugs at the top or bottom of the stairs. If you do have throw rugs, attach them to the floor with carpet tape.  Make sure that you have a light switch at the top of the stairs and the bottom of the stairs. If you do not have them, ask someone to add them for you. What  else can I do to help prevent falls?  Wear shoes that:  Do not have high heels.  Have rubber bottoms.  Are comfortable and fit you well.  Are closed at the toe. Do not wear sandals.  If you use a stepladder:  Make sure that it is fully opened. Do not climb a closed stepladder.  Make sure that both sides of the stepladder are locked into place.  Ask someone to hold it for you, if possible.  Clearly mark and make sure that you can see:  Any grab bars or handrails.  First and last steps.  Where the edge of each step is.  Use tools that help you move around (mobility aids) if they are needed. These include:  Canes.  Walkers.  Scooters.  Crutches.  Turn on the lights when you go into a dark area. Replace any light bulbs as soon as they burn out.  Set up your furniture so you have a clear path. Avoid moving your furniture around.  If any of your floors are uneven, fix them.  If there are any pets around you, be aware of where they are.  Review your medicines with your doctor. Some medicines can make you feel dizzy. This can increase your chance of falling. Ask your doctor what other things that you can do to help prevent falls. This information is not intended to replace advice given to you by your health care provider. Make sure you discuss any questions you have with your health care provider. Document Released: 04/21/2009 Document Revised: 12/01/2015 Document Reviewed: 07/30/2014 Elsevier Interactive Patient Education  2017 [employer].

## 2018-04-02 NOTE — Progress Notes (Signed)
Subjective:   Daniel Brandt is a 84 y.o. male who presents for Medicare Annual/Subsequent preventive examination.  Review of Systems:  N/A Cardiac Risk Factors include: advanced age (>28men, >67 women);hypertension;male gender;obesity (BMI >30kg/m2);sedentary lifestyle     Objective:    Vitals: BP 112/68 (BP Location: Right Arm, Patient Position: Sitting, Cuff Size: Normal)   Pulse 60   Temp 97.6 F (36.4 C) (Oral)   Ht 6\' 1"  (1.854 m)   Wt 261 lb 3.2 oz (118.5 kg)   SpO2 96%   BMI 34.46 kg/m   Body mass index is 34.46 kg/m.  Advanced Directives 04/02/2018 04/01/2017 10/04/2016  Does Patient Have a Medical Advance Directive? Yes Yes Yes  Type of Paramedic of [place];Living will [place];Living will [place];Living will  Copy of [place] in Chart? No - copy requested No - copy requested -    Tobacco Social History   Tobacco Use  Smoking Status Former Smoker  . Packs/day: 1.00  . Years: 0.50  . Pack years: 0.50  . Types: Cigarettes  . Last attempt to quit: 10/04/1956  . Years since quitting: 61.5  Smokeless Tobacco Never Used  Tobacco Comment   smoking cessation materials not required     Counseling given: No Comment: smoking cessation materials not required  Clinical Intake:  Pre-visit preparation completed: Yes  Pain : No/denies pain   BMI - recorded: 34.46 Nutritional Status: BMI > 30  Obese Nutritional Risks: None Diabetes: No  How often do you need to have someone help you when you read instructions, pamphlets, or other written materials from your doctor or pharmacy?: 1 - Never  Interpreter Needed?: No  Information entered by :: AEversole, LPN  Past Medical History:  Diagnosis Date  . A-fib ([place])   . Arthritis   . Former smoker   . Hypertension   . Malaria   . Stroke [facility])    Past Surgical History:  Procedure Laterality Date  . FRACTURE SURGERY    . KNEE  REPLACE Bilateral 2017  . LUMBAR FUSION  1995   L3 L4   Family History  Problem Relation Age of Onset  . Heart disease Mother   . Heart disease Father   . Gout Sister   . Heart attack Son    Social History   Socioeconomic History  . Marital status: Married    Spouse name: Not on file  . Number of children: 6  . Years of education: Not on file  . Highest education level: Associate degree: academic program  Occupational History  . Occupation: Retired  Scientific laboratory technician  . Financial resource strain: Not hard at all  . Food insecurity:    Worry: Never true    Inability: Never true  . Transportation needs:    Medical: No    Non-medical: No  Tobacco Use  . Smoking status: Former Smoker    Packs/day: 1.00    Years: 0.50    Pack years: 0.50    Types: Cigarettes    Last attempt to quit: 10/04/1956    Years since quitting: 61.5  . Smokeless tobacco: Never Used  . Tobacco comment: smoking cessation materials not required  Substance and Sexual Activity  . Alcohol use: Yes    Comment: 1750 ML DAY OF SCOTCH  . Drug use: No  . Sexual activity: Not Currently  Lifestyle  . Physical activity:    Days per week: 0 days  Minutes per session: 0 min  . Stress: Not at all  Relationships  . Social connections:    Talks on phone: Patient refused    Gets together: Patient refused    Attends religious service: Patient refused    Active member of club or organization: Patient refused    Attends meetings of clubs or organizations: Patient refused    Relationship status: Married  Other Topics Concern  . Not on file  Social History Narrative  . Not on file    Outpatient Encounter Medications as of 04/02/2018  Medication Sig  . aspirin 81 MG tablet Take 1 tablet (81 mg total) by mouth daily.  . metoprolol succinate (TOPROL-XL) 100 MG 24 hr tablet Take 100 mg by mouth daily. Take with or immediately following a meal.  . Multiple Vitamins-Minerals (MULTIVITAMIN WITH MINERALS) tablet Take 1  tablet by mouth daily.  Marland Kitchen olmesartan (BENICAR) 40 MG tablet TAKE 1 TABLET BY MOUTH ONCE DAILY  . Omega-3 Fatty Acids (FISH OIL) 1000 MG CAPS Take 2 capsules by mouth 2 (two) times daily.   No facility-administered encounter medications on file as of 04/02/2018.     Activities of Daily Living In your present state of health, do you have any difficulty performing the following activities: 04/02/2018  Hearing? N  Comment denies hearing aids  Vision? N  Comment wears eyeglasses  Difficulty concentrating or making decisions? N  Walking or climbing stairs? N  Dressing or bathing? N  Doing errands, shopping? N  Preparing Food and eating ? N  Comment denies dentures  Using the Toilet? N  In the past six months, have you accidently leaked urine? N  Do you have problems with loss of bowel control? N  Managing your Medications? N  Managing your Finances? N  Housekeeping or managing your Housekeeping? N  Some recent data might be hidden    Patient Care Team: Glean Hess, MD as PCP - General (Internal Medicine)   Assessment:   This is a routine wellness examination for Daniel Brandt.  Exercise Activities and Dietary recommendations Current Exercise Habits: The patient does not participate in regular exercise at present, Exercise limited by: None identified  Goals    . Increase water intake     Recommend increasing fluid intake to 5-6 glasses throughout entire day    . Prevent falls     Recommend to remove any items from the home that may cause slips or trips.       Fall Risk Fall Risk  04/02/2018 04/01/2017 10/04/2016  Falls in the past year? No No No  Risk for fall due to : Impaired vision;History of fall(s) - -  Risk for fall due to: Comment wears eyeglasses - -   FALL RISK PREVENTION PERTAINING TO THE HOME:  Any stairs in or around the home WITH handrails? Yes  Home free of loose throw rugs in walkways, pet beds, electrical cords, etc? Yes  Adequate lighting in your home to  reduce risk of falls? Yes   ASSISTIVE DEVICES UTILIZED TO PREVENT FALLS:  Life alert? No  Use of a cane, walker or w/c? No  Grab bars in the bathroom? No  Shower chair or bench in shower? No  Elevated toilet seat or a handicapped toilet? No   DME ORDERS:  DME order needed?  No   TIMED UP AND GO:  Was the test performed? Yes .  Length of time to ambulate 10 feet: 6 sec.   GAIT:  Appearance of gait:  Gait stead-fast and without the use of an assistive device.  Education: Fall risk prevention has been discussed.  Intervention(s) required? No   Depression Screen PHQ 2/9 Scores 04/02/2018 04/01/2017 10/04/2016  PHQ - 2 Score 0 0 0  PHQ- 9 Score 0 - -    Cognitive Function     6CIT Screen 04/02/2018 04/01/2017  What Year? 0 points 0 points  What month? 0 points 0 points  What time? 0 points 0 points  Count back from 20 0 points 0 points  Months in reverse 0 points 0 points  Repeat phrase 0 points 0 points  Total Score 0 0    Immunization History  Administered Date(s) Administered  . Influenza, High Dose Seasonal PF 04/01/2017, 04/02/2018  . Influenza,inj,Quad PF,6+ Mos 03/09/2016  . Pneumococcal Conjugate-13 03/09/2016  . Pneumococcal Polysaccharide-23 10/09/2017  . Tdap 10/04/2016  . Zoster Recombinat (Shingrix) 08/20/2016    Qualifies for Shingles Vaccine? Yes  Completed 1st dose of Shingrix. Due for second dose. Advised to provide a copy of his vaccination record once completed.  Flu Vaccine: Due for Flu vaccine. Does the patient want to receive this vaccine today?  Yes    Screening Tests Health Maintenance  Topic Date Due  . TETANUS/TDAP  10/05/2026  . INFLUENZA VACCINE  Completed  . PNA vac Low Risk Adult  Completed  Cancer Screenings:  Colorectal Screening: No longer required  Lung Cancer Screening: (Low Dose CT Chest recommended if Age 52-80 years, 30 pack-year currently smoking OR have quit w/in 15years.) does not qualify.   Additional  Screening:  Hepatitis C Screening: does not qualify  Vision Screening: Recommended annual ophthalmology exams for early detection of glaucoma and other disorders of the eye. Is the patient up to date with their annual eye exam?  No  Who is the provider or what is the name of the office in which the pt attends annual eye exams? Not established with a provider If pt is not established with a provider, would they like to be referred to a provider to establish care? Yes . Ophthalmology referral has been placed. Pt advised our office will call re: their appt.  Dental Screening: Recommended annual dental exams for proper oral hygiene    Plan:  I have personally reviewed and addressed the Medicare Annual Wellness questionnaire and have noted the following in the patient's chart:  A. Medical and social history B. Use of alcohol, tobacco or illicit drugs  C. Current medications and supplements D. Functional ability and status E.  Nutritional status F.  Physical activity G. Advance directives H. List of other physicians I.  Hospitalizations, surgeries, and ER visits in previous 12 months J.  [place] such as hearing and vision if needed, cognitive and depression L. Referrals and appointments  In addition, I have reviewed and discussed with patient certain preventive protocols, quality metrics, and best practice recommendations. A written personalized care plan for preventive services as well as general preventive health recommendations were provided to patient.  Signed,  Aleatha Borer, LPN Nurse Health Advisor  MD Recommendations: Completed 1st dose of Shingrix. Due for second dose. Advised to provide a copy of his vaccination record once completed.  Vision Screening: Not established with a provider. Ophthalmology referral has been placed. Pt advised our office will call re: their appt.

## 2018-04-22 ENCOUNTER — Encounter: Payer: Self-pay | Admitting: Internal Medicine

## 2018-04-23 ENCOUNTER — Encounter: Payer: Self-pay | Admitting: Internal Medicine

## 2018-04-23 ENCOUNTER — Ambulatory Visit (INDEPENDENT_AMBULATORY_CARE_PROVIDER_SITE_OTHER): Payer: Medicare Other | Admitting: Internal Medicine

## 2018-04-23 VITALS — BP 114/70 | HR 70 | Ht 73.0 in | Wt 257.2 lb

## 2018-04-23 DIAGNOSIS — G4733 Obstructive sleep apnea (adult) (pediatric): Secondary | ICD-10-CM | POA: Diagnosis not present

## 2018-04-23 DIAGNOSIS — I10 Essential (primary) hypertension: Secondary | ICD-10-CM

## 2018-04-23 DIAGNOSIS — I739 Peripheral vascular disease, unspecified: Secondary | ICD-10-CM

## 2018-04-23 DIAGNOSIS — G609 Hereditary and idiopathic neuropathy, unspecified: Secondary | ICD-10-CM | POA: Diagnosis not present

## 2018-04-23 DIAGNOSIS — I482 Chronic atrial fibrillation, unspecified: Secondary | ICD-10-CM

## 2018-04-23 DIAGNOSIS — J3089 Other allergic rhinitis: Secondary | ICD-10-CM

## 2018-04-23 DIAGNOSIS — M170 Bilateral primary osteoarthritis of knee: Secondary | ICD-10-CM

## 2018-04-23 DIAGNOSIS — Z9989 Dependence on other enabling machines and devices: Secondary | ICD-10-CM

## 2018-04-23 NOTE — Progress Notes (Signed)
Date:  04/23/2018   Name:  Daniel Brandt   DOB:  10/19/1933   MRN:  [id number]   Chief Complaint: Hypertension  Hypertension  This is a chronic problem. The problem is controlled. Pertinent negatives include no chest pain, headaches, neck pain or palpitations. Past treatments include beta blockers and angiotensin blockers. The current treatment provides significant improvement.  Hip Pain   There was no injury mechanism. The pain is present in the left hip and right hip. The quality of the pain is described as aching. The pain is moderate. The pain has been constant since onset.  OSA - on since 2002 and doing well.  Sleep is good, no am headaches, daytime somnolence.  Atrial Fibrillation - chronic for 40 years, rate controlled. Currently on aspirin.  Not interested in anticoagulation.  Review of Systems  Constitutional: Negative for chills, fatigue, fever and unexpected weight change.  Respiratory: Negative for cough, wheezing and stridor.   Cardiovascular: Negative for chest pain, palpitations and leg swelling.  Gastrointestinal: Negative for abdominal pain, blood in stool and constipation.  Musculoskeletal: Positive for arthralgias and gait problem. Negative for neck pain.  Skin: Negative for color change and rash.  Neurological: Negative for dizziness and headaches.  Psychiatric/Behavioral: Negative for dysphoric mood and sleep disturbance. The patient is not nervous/anxious.     Patient Active Problem List   Diagnosis Date Noted  . Peripheral neuropathy, idiopathic 04/23/2018  . Environmental and seasonal allergies 04/23/2018  . OSA on CPAP 10/09/2017  . Ganglion cyst of finger of left hand 12/04/2016  . Obesity (BMI 30-39.9) 12/04/2016  . Chronic atrial fibrillation 10/04/2016  . PVD (peripheral vascular disease) ([place]) 10/04/2016  . Essential hypertension 03/09/2016  . Osteoarthritis of both knees 03/09/2016  . Psoriasis 03/09/2016    No Known Allergies  Past  Surgical History:  Procedure Laterality Date  . COLONOSCOPY  06/12/2016  . FRACTURE SURGERY    . KNEE REPLACE Bilateral 2017  . LUMBAR FUSION  1995   L3 L4    Social History   Tobacco Use  . Smoking status: Former Smoker    Packs/day: 1.00    Years: 0.50    Pack years: 0.50    Types: Cigarettes    Last attempt to quit: 10/04/1956    Years since quitting: 61.5  . Smokeless tobacco: Never Used  . Tobacco comment: smoking cessation materials not required  Substance Use Topics  . Alcohol use: Yes    Comment: 1750 ML DAY OF SCOTCH  . Drug use: No     Medication list has been reviewed and updated.  Current Meds  Medication Sig  . aspirin 81 MG tablet Take 1 tablet (81 mg total) by mouth daily.  . metoprolol succinate (TOPROL-XL) 100 MG 24 hr tablet Take 100 mg by mouth daily. Take with or immediately following a meal.  . Multiple Vitamins-Minerals (MULTIVITAMIN WITH MINERALS) tablet Take 1 tablet by mouth daily.  Marland Kitchen olmesartan (BENICAR) 40 MG tablet TAKE 1 TABLET BY MOUTH ONCE DAILY  . Omega-3 Fatty Acids (FISH OIL) 1000 MG CAPS Take 2 capsules by mouth 2 (two) times daily.  . vitamin B-12 (CYANOCOBALAMIN) 500 MCG tablet Take 500 mcg by mouth daily.    PHQ 2/9 Scores 04/02/2018 04/01/2017 10/04/2016  PHQ - 2 Score 0 0 0  PHQ- 9 Score 0 - -    Physical Exam  Constitutional: He is oriented to person, place, and time. He appears well-developed. No distress.  HENT:  Head: Normocephalic  and atraumatic.  Eyes: Pupils are equal, round, and reactive to light.  Neck: Normal range of motion. Neck supple. Carotid bruit is not present.  Cardiovascular: Normal rate and normal heart sounds. An irregularly irregular rhythm present. Exam reveals no gallop.  No murmur heard. Pulses:      Dorsalis pedis pulses are 1+ on the right side, and 1+ on the left side.  Pulmonary/Chest: Effort normal and breath sounds normal. No respiratory distress. He has no wheezes. He has no rhonchi.    Musculoskeletal: Normal range of motion.       Feet:  Lymphadenopathy:    He has no cervical adenopathy.  Neurological: He is alert and oriented to person, place, and time.  Skin: Skin is warm and dry. No rash noted.  Psychiatric: He has a normal mood and affect. His speech is normal and behavior is normal. Thought content normal.  Nursing note and vitals reviewed.   BP 114/70 (BP Location: Right Arm, Patient Position: Sitting, Cuff Size: Large)   Pulse 70   Ht 6\' 1"  (1.854 m)   Wt 257 lb 3.2 oz (116.7 kg)   SpO2 96%   BMI 33.93 kg/m   Assessment and Plan: 1. Essential hypertension controlled - CBC with Differential/Platelet - Comprehensive metabolic panel  2. Peripheral neuropathy, idiopathic Long-standing, no treatment tried - Vitamin B12  3. Chronic atrial fibrillation Rate stable, continue aspirin 81 mg DOAC discussed but pt decided against - TSH  4. OSA on CPAP Doing well with good compliance  5. Environmental and seasonal allergies Unchanged S/p allergy testing with no cause found   Partially dictated using Editor, commissioning. Any errors are unintentional.  Halina Maidens, MD [facility]  04/23/2018

## 2018-04-24 LAB — COMPREHENSIVE METABOLIC PANEL
A/G RATIO: 2.2 (ref 1.2–2.2)
ALK PHOS: 50 IU/L (ref 39–117)
ALT: 24 IU/L (ref 0–44)
AST: 24 IU/L (ref 0–40)
Albumin: 4.3 g/dL (ref 3.5–4.7)
BUN/Creatinine Ratio: 18 (ref 10–24)
BUN: 17 mg/dL (ref 8–27)
Bilirubin Total: 0.8 mg/dL (ref 0.0–1.2)
CHLORIDE: 101 mmol/L (ref 96–106)
CO2: 22 mmol/L (ref 20–29)
Calcium: 9.4 mg/dL (ref 8.6–10.2)
Creatinine, Ser: 0.97 mg/dL (ref 0.76–1.27)
GFR calc Af Amer: 84 mL/min/{1.73_m2} (ref >59)
GFR calc non Af Amer: 73 mL/min/{1.73_m2} (ref >59)
GLUCOSE: 98 mg/dL (ref 65–99)
Globulin, Total: 2 g/dL (ref 1.5–4.5)
POTASSIUM: 4.8 mmol/L (ref 3.5–5.2)
Sodium: 139 mmol/L (ref 134–144)
Total Protein: 6.3 g/dL (ref 6.0–8.5)

## 2018-04-24 LAB — CBC WITH DIFFERENTIAL/PLATELET
BASOS ABS: 0 10*3/uL (ref 0.0–0.2)
Basos: 0 %
EOS (ABSOLUTE): 0.2 10*3/uL (ref 0.0–0.4)
Eos: 3 %
Hematocrit: 40.5 % (ref 37.5–51.0)
Hemoglobin: 14.5 g/dL (ref 13.0–17.7)
Immature Grans (Abs): 0 10*3/uL (ref 0.0–0.1)
Immature Granulocytes: 1 %
LYMPHS ABS: 1.4 10*3/uL (ref 0.7–3.1)
Lymphs: 24 %
MCH: 36.3 pg — ABNORMAL HIGH (ref 26.6–33.0)
MCHC: 35.8 g/dL — AB (ref 31.5–35.7)
MCV: 102 fL — AB (ref 79–97)
Monocytes Absolute: 0.6 10*3/uL (ref 0.1–0.9)
Monocytes: 10 %
Neutrophils Absolute: 3.8 10*3/uL (ref 1.4–7.0)
Neutrophils: 62 %
PLATELETS: 200 10*3/uL (ref 150–450)
RBC: 3.99 x10E6/uL — ABNORMAL LOW (ref 4.14–5.80)
RDW: 13.1 % (ref 12.3–15.4)
WBC: 6.1 10*3/uL (ref 3.4–10.8)

## 2018-04-24 LAB — VITAMIN B12: Vitamin B-12: 1090 pg/mL (ref 232–1245)

## 2018-04-24 LAB — TSH: TSH: 2.79 u[IU]/mL (ref 0.450–4.500)

## 2018-05-07 DIAGNOSIS — H538 Other visual disturbances: Secondary | ICD-10-CM | POA: Diagnosis not present

## 2018-05-19 ENCOUNTER — Ambulatory Visit: Payer: Medicare Other | Admitting: Internal Medicine

## 2018-05-19 ENCOUNTER — Encounter: Payer: Self-pay | Admitting: Internal Medicine

## 2018-05-19 DIAGNOSIS — M722 Plantar fascial fibromatosis: Secondary | ICD-10-CM | POA: Insufficient documentation

## 2018-05-19 NOTE — Progress Notes (Signed)
Date:  05/19/2018   Name:  Daniel Brandt   DOB:  March 19, 1934   MRN:  [id number]   Chief Complaint: Foot Pain (Right foot heel pain x 2-3 months redness and swelling now and then. )  Foot Pain  This is a chronic problem. The current episode started more than 1 month ago. The problem occurs daily. The problem has been unchanged. Associated symptoms include arthralgias and joint swelling. Pertinent negatives include no chills, fatigue or fever. The symptoms are aggravated by standing. He has tried NSAIDs and rest for the symptoms. The treatment provided no relief.    Review of Systems  Constitutional: Negative for chills, fatigue and fever.  Respiratory: Negative for chest tightness and shortness of breath.   Musculoskeletal: Positive for arthralgias, gait problem and joint swelling.    Patient Active Problem List   Diagnosis Date Noted  . Peripheral neuropathy, idiopathic 04/23/2018  . Environmental and seasonal allergies 04/23/2018  . OSA on CPAP 10/09/2017  . Ganglion cyst of finger of left hand 12/04/2016  . Obesity (BMI 30-39.9) 12/04/2016  . Chronic atrial fibrillation 10/04/2016  . PVD (peripheral vascular disease) ([place]) 10/04/2016  . Essential hypertension 03/09/2016  . Osteoarthritis of both knees 03/09/2016  . Psoriasis 03/09/2016    No Known Allergies  Past Surgical History:  Procedure Laterality Date  . COLONOSCOPY  06/12/2016  . FRACTURE SURGERY    . KNEE REPLACE Bilateral 2017  . LUMBAR FUSION  1995   L3 L4    Social History   Tobacco Use  . Smoking status: Former Smoker    Packs/day: 1.00    Years: 0.50    Pack years: 0.50    Types: Cigarettes    Last attempt to quit: 10/04/1956    Years since quitting: 61.6  . Smokeless tobacco: Never Used  . Tobacco comment: smoking cessation materials not required  Substance Use Topics  . Alcohol use: Yes    Comment: 1750 ML DAY OF SCOTCH  . Drug use: No     Medication list has been reviewed and  updated.  Current Meds  Medication Sig  . aspirin 81 MG tablet Take 1 tablet (81 mg total) by mouth daily.  . metoprolol succinate (TOPROL-XL) 100 MG 24 hr tablet Take 100 mg by mouth daily. Take with or immediately following a meal.  . Multiple Vitamins-Minerals (MULTIVITAMIN WITH MINERALS) tablet Take 1 tablet by mouth daily.  Marland Kitchen olmesartan (BENICAR) 40 MG tablet TAKE 1 TABLET BY MOUTH ONCE DAILY  . Omega-3 Fatty Acids (FISH OIL) 1000 MG CAPS Take 2 capsules by mouth 2 (two) times daily.  . vitamin B-12 (CYANOCOBALAMIN) 500 MCG tablet Take 500 mcg by mouth daily.    PHQ 2/9 Scores 04/02/2018 04/01/2017 10/04/2016  PHQ - 2 Score 0 0 0  PHQ- 9 Score 0 - -    Physical Exam  Constitutional: He is oriented to person, place, and time. He appears well-developed. No distress.  HENT:  Head: Normocephalic and atraumatic.  Pulmonary/Chest: Effort normal. No respiratory distress.  Musculoskeletal: He exhibits edema (trace ankle edema).       Left ankle: He exhibits decreased range of motion and swelling.       Feet:  Neurological: He is alert and oriented to person, place, and time.  Skin: Skin is warm and dry. No rash noted.  Psychiatric: He has a normal mood and affect. His behavior is normal. Thought content normal.  Nursing note and vitals reviewed.   BP 117/78  Pulse 79   Resp 16   Ht 6\' 1"  (1.854 m)   Wt 257 lb (116.6 kg)   SpO2 97%   BMI 33.91 kg/m   Assessment and Plan: 1. Plantar fasciitis of left foot Advil 600 mg tid Ice massage twice a day - Ambulatory referral to Podiatry   Partially dictated using [employer]. Any errors are unintentional.  Halina Maidens, MD [facility]  05/19/2018

## 2018-05-19 NOTE — Patient Instructions (Signed)
Advil 600 mg three times a day  Ice massage for 10 minutes twice a day  Elevate leg "toes above the nose"

## 2018-05-26 DIAGNOSIS — M7662 Achilles tendinitis, left leg: Secondary | ICD-10-CM | POA: Diagnosis not present

## 2018-06-25 DIAGNOSIS — M7662 Achilles tendinitis, left leg: Secondary | ICD-10-CM | POA: Diagnosis not present

## 2018-08-06 DIAGNOSIS — M25561 Pain in right knee: Secondary | ICD-10-CM | POA: Diagnosis not present

## 2018-08-06 DIAGNOSIS — Z96651 Presence of right artificial knee joint: Secondary | ICD-10-CM | POA: Diagnosis not present

## 2018-08-06 DIAGNOSIS — Z96652 Presence of left artificial knee joint: Secondary | ICD-10-CM | POA: Diagnosis not present

## 2018-09-04 ENCOUNTER — Other Ambulatory Visit: Payer: Self-pay

## 2018-09-04 ENCOUNTER — Ambulatory Visit (INDEPENDENT_AMBULATORY_CARE_PROVIDER_SITE_OTHER): Payer: Medicare Other | Admitting: Internal Medicine

## 2018-09-04 ENCOUNTER — Encounter: Payer: Self-pay | Admitting: Internal Medicine

## 2018-09-04 VITALS — BP 118/76 | HR 84 | Ht 73.0 in | Wt 260.0 lb

## 2018-09-04 DIAGNOSIS — R5383 Other fatigue: Secondary | ICD-10-CM

## 2018-09-04 DIAGNOSIS — I739 Peripheral vascular disease, unspecified: Secondary | ICD-10-CM

## 2018-09-04 DIAGNOSIS — I10 Essential (primary) hypertension: Secondary | ICD-10-CM

## 2018-09-04 DIAGNOSIS — R0602 Shortness of breath: Secondary | ICD-10-CM

## 2018-09-04 DIAGNOSIS — I482 Chronic atrial fibrillation, unspecified: Secondary | ICD-10-CM | POA: Diagnosis not present

## 2018-09-04 DIAGNOSIS — M791 Myalgia, unspecified site: Secondary | ICD-10-CM

## 2018-09-04 NOTE — Progress Notes (Signed)
Date:  09/04/2018   Name:  Daniel Brandt   DOB:  [birth year]   MRN:  [id number]   Chief Complaint: Shortness of Breath (Not new. Been happening but progessing. No chest pains. No stamina and walking slowly. )  HPI Chronic Atrial fibrillation -  On daily aspirin.  He has had this for many years.  He does not notice any change in his heart rate, no dizziness or tachycardia.  He has had no bleeding problems.  Visual changes - he reports intermittent flashing lights in his right eye.  This has been evaluated in the past with normal neurological and ophthalmological findings.  He denies HA associated with it.  He had it briefly today while waiting but it has now resolved.  Fatigue -  Increasing joint and muscle pain with activity.  He uses to be able to climb the stairs since his knee replacement in 2016 - no longer able to do so without being very winded.  He can not go to [facility] and keep up with his wife.  He denies chest pain or heaviness, no wheezing or cough, no dizziness, nausea or diaphoresis. He has noticed some aches in his thigh muscles, esp on the right. His muscle strength seems normal and he feels good at rest.   Review of Systems  Constitutional: Positive for fatigue. Negative for chills, diaphoresis, fever and unexpected weight change.  HENT: Negative for trouble swallowing.   Eyes: Positive for visual disturbance.  Respiratory: Positive for shortness of breath. Negative for chest tightness and wheezing.   Cardiovascular: Negative for chest pain, palpitations and leg swelling.  Gastrointestinal: Negative for abdominal pain, constipation and diarrhea.  Musculoskeletal: Positive for arthralgias, gait problem and myalgias.  Skin: Negative for rash.  Allergic/Immunologic: Positive for environmental allergies.  Neurological: Negative for dizziness, weakness, light-headedness and headaches.  Psychiatric/Behavioral: Negative for dysphoric mood and sleep disturbance. The patient is  not nervous/anxious.     Patient Active Problem List   Diagnosis Date Noted  . Plantar fasciitis of left foot 05/19/2018  . Peripheral neuropathy, idiopathic 04/23/2018  . Environmental and seasonal allergies 04/23/2018  . OSA on CPAP 10/09/2017  . Ganglion cyst of finger of left hand 12/04/2016  . Chronic atrial fibrillation 10/04/2016  . PVD (peripheral vascular disease) ([place]) 10/04/2016  . Essential hypertension 03/09/2016  . Osteoarthritis of both knees 03/09/2016  . Psoriasis 03/09/2016    No Known Allergies  Past Surgical History:  Procedure Laterality Date  . COLONOSCOPY  06/12/2016  . FRACTURE SURGERY    . KNEE REPLACE Bilateral 2017  . LUMBAR FUSION  1995   L3 L4    Social History   Tobacco Use  . Smoking status: Former Smoker    Packs/day: 1.00    Years: 0.50    Pack years: 0.50    Types: Cigarettes    Last attempt to quit: 10/04/1956    Years since quitting: 61.9  . Smokeless tobacco: Never Used  . Tobacco comment: smoking cessation materials not required  Substance Use Topics  . Alcohol use: Yes    Comment: 1750 ML DAY OF SCOTCH  . Drug use: No     Medication list has been reviewed and updated.  Current Meds  Medication Sig  . aspirin 81 MG tablet Take 1 tablet (81 mg total) by mouth daily.  . metoprolol succinate (TOPROL-XL) 100 MG 24 hr tablet Take 100 mg by mouth daily. Take with or immediately following a meal.  . Multiple Vitamins-Minerals (MULTIVITAMIN  WITH MINERALS) tablet Take 1 tablet by mouth daily.  Marland Kitchen olmesartan (BENICAR) 40 MG tablet TAKE 1 TABLET BY MOUTH ONCE DAILY  . Omega-3 Fatty Acids (FISH OIL) 1000 MG CAPS Take 2 capsules by mouth 2 (two) times daily.  . vitamin B-12 (CYANOCOBALAMIN) 500 MCG tablet Take 500 mcg by mouth daily.    PHQ 2/9 Scores 09/04/2018 04/02/2018 04/01/2017 10/04/2016  PHQ - 2 Score 0 0 0 0  PHQ- 9 Score - 0 - -    Physical Exam Vitals signs and nursing note reviewed.  Constitutional:      General: He is  not in acute distress.    Appearance: He is well-developed.  HENT:     Head: Normocephalic and atraumatic.     Comments: No temporal artery tenderness Eyes:     Extraocular Movements: Extraocular movements intact.     Conjunctiva/sclera: Conjunctivae normal.     Pupils: Pupils are equal, round, and reactive to light.  Neck:     Musculoskeletal: No muscular tenderness.  Cardiovascular:     Rate and Rhythm: Rhythm regularly irregular.     Chest Wall: PMI is not displaced. No thrill.     Heart sounds: Normal heart sounds. No murmur.  Pulmonary:     Effort: Pulmonary effort is normal. No respiratory distress.     Breath sounds: Normal breath sounds. No decreased breath sounds, wheezing or rhonchi.  Abdominal:     General: Bowel sounds are normal.     Palpations: Abdomen is soft.     Tenderness: There is no abdominal tenderness.  Musculoskeletal: Normal range of motion.     Right shoulder: Normal.     Left shoulder: Normal.     Right knee: He exhibits no swelling and no effusion.     Left knee: He exhibits no swelling and no effusion.     Right lower leg: No edema.     Left lower leg: No edema.     Comments: No tenderness to palpation of lateral thighs No calf cord or tenderness  Lymphadenopathy:     Cervical: No cervical adenopathy.     Right cervical: No posterior cervical adenopathy.    Left cervical: No posterior cervical adenopathy.  Skin:    General: Skin is warm and dry.     Findings: No rash.  Neurological:     General: No focal deficit present.     Mental Status: He is alert and oriented to person, place, and time.     Cranial Nerves: Cranial nerves are intact.     Sensory: Sensation is intact.     Gait: Gait normal.     Deep Tendon Reflexes:     Reflex Scores:      Bicep reflexes are 1+ on the right side and 1+ on the left side.      Patellar reflexes are 1+ on the right side and 1+ on the left side. Psychiatric:        Attention and Perception: Attention normal.         Mood and Affect: Mood normal.        Behavior: Behavior normal.        Thought Content: Thought content normal.     BP 118/76   Pulse 84   Ht 6\' 1"  (1.854 m)   Wt 260 lb (117.9 kg)   SpO2 96%   BMI 34.30 kg/m   Assessment and Plan: 1. Fatigue, unspecified type Concern for occult CAD - Ambulatory referral to Cardiology  2. Myalgia No objective findings to suggest PMR or GCA - CK (Creatine Kinase)  3. SOB (shortness of breath) afib at controlled rate on EKG - no acute changes - EKG 12-Lead  4. Chronic atrial fibrillation Rate controlled - Ambulatory referral to Cardiology scheduled 09/08/18 10:30 AM - EKG 12-Lead  5. Essential hypertension controlled - CBC with Differential/Platelet - Comprehensive metabolic panel - TSH  6. PVD (peripheral vascular disease) ([facility]) Mild decrease in peripheral pulses - EKG 12-Lead   Partially dictated using [employer]. Any errors are unintentional.  Halina Maidens, MD [facility]  09/04/2018

## 2018-09-11 LAB — CBC WITH DIFFERENTIAL/PLATELET
BASOS: 0 %
Basophils Absolute: 0 10*3/uL (ref 0.0–0.2)
EOS (ABSOLUTE): 0.2 10*3/uL (ref 0.0–0.4)
Eos: 4 %
HEMATOCRIT: 40.3 % (ref 37.5–51.0)
HEMOGLOBIN: 13.9 g/dL (ref 13.0–17.7)
Immature Grans (Abs): 0 10*3/uL (ref 0.0–0.1)
Immature Granulocytes: 1 %
LYMPHS ABS: 1.5 10*3/uL (ref 0.7–3.1)
Lymphs: 36 %
MCH: 35.8 pg — ABNORMAL HIGH (ref 26.6–33.0)
MCHC: 34.5 g/dL (ref 31.5–35.7)
MCV: 104 fL — AB (ref 79–97)
MONOS ABS: 0.4 10*3/uL (ref 0.1–0.9)
Monocytes: 10 %
NEUTROS ABS: 2 10*3/uL (ref 1.4–7.0)
Neutrophils: 49 %
Platelets: 188 10*3/uL (ref 150–450)
RBC: 3.88 x10E6/uL — ABNORMAL LOW (ref 4.14–5.80)
RDW: 13 % (ref 11.6–15.4)
WBC: 4.1 10*3/uL (ref 3.4–10.8)

## 2018-09-11 LAB — COMPREHENSIVE METABOLIC PANEL
ALBUMIN: 4.4 g/dL (ref 3.6–4.6)
ALT: 29 IU/L (ref 0–44)
AST: 25 IU/L (ref 0–40)
Albumin/Globulin Ratio: 2.3 — ABNORMAL HIGH (ref 1.2–2.2)
Alkaline Phosphatase: 41 IU/L (ref 39–117)
BUN / CREAT RATIO: 13 (ref 10–24)
BUN: 15 mg/dL (ref 8–27)
Bilirubin Total: 1 mg/dL (ref 0.0–1.2)
CALCIUM: 9.3 mg/dL (ref 8.6–10.2)
CO2: 21 mmol/L (ref 20–29)
CREATININE: 1.18 mg/dL (ref 0.76–1.27)
Chloride: 102 mmol/L (ref 96–106)
GFR calc Af Amer: 66 mL/min/{1.73_m2} (ref >59)
GFR, EST NON AFRICAN AMERICAN: 57 mL/min/{1.73_m2} — AB (ref >59)
GLOBULIN, TOTAL: 1.9 g/dL (ref 1.5–4.5)
GLUCOSE: 94 mg/dL (ref 65–99)
Potassium: 4.7 mmol/L (ref 3.5–5.2)
Sodium: 143 mmol/L (ref 134–144)
Total Protein: 6.3 g/dL (ref 6.0–8.5)

## 2018-09-11 LAB — TSH: TSH: 5.44 u[IU]/mL — AB (ref 0.450–4.500)

## 2018-09-11 LAB — CK: CK TOTAL: 115 U/L (ref 24–204)

## 2018-09-14 ENCOUNTER — Encounter: Payer: Self-pay | Admitting: Internal Medicine

## 2018-09-19 ENCOUNTER — Other Ambulatory Visit: Payer: Self-pay | Admitting: Family Medicine

## 2018-09-20 ENCOUNTER — Other Ambulatory Visit: Payer: Self-pay | Admitting: Internal Medicine

## 2018-09-20 MED ORDER — METOPROLOL SUCCINATE ER 100 MG PO TB24: 100.0000 mg | ORAL_TABLET | Freq: Every day | ORAL | 1 refills | Status: DC

## 2018-10-04 LAB — T3: T3, Total: 99 ng/dL (ref 71–180)

## 2018-10-04 LAB — T4: T4 TOTAL: 5.2 ug/dL (ref 4.5–12.0)

## 2018-10-04 LAB — SPECIMEN STATUS REPORT

## 2018-10-16 DIAGNOSIS — I10 Essential (primary) hypertension: Secondary | ICD-10-CM | POA: Diagnosis not present

## 2018-10-16 DIAGNOSIS — I739 Peripheral vascular disease, unspecified: Secondary | ICD-10-CM | POA: Diagnosis not present

## 2018-10-16 DIAGNOSIS — G4733 Obstructive sleep apnea (adult) (pediatric): Secondary | ICD-10-CM | POA: Diagnosis not present

## 2018-10-16 DIAGNOSIS — I482 Chronic atrial fibrillation, unspecified: Secondary | ICD-10-CM | POA: Diagnosis not present

## 2018-10-23 ENCOUNTER — Other Ambulatory Visit: Payer: Self-pay | Admitting: Internal Medicine

## 2018-10-23 ENCOUNTER — Encounter: Payer: Self-pay | Admitting: Internal Medicine

## 2018-10-23 ENCOUNTER — Ambulatory Visit (INDEPENDENT_AMBULATORY_CARE_PROVIDER_SITE_OTHER): Payer: Medicare Other | Admitting: Internal Medicine

## 2018-10-23 ENCOUNTER — Ambulatory Visit (INDEPENDENT_AMBULATORY_CARE_PROVIDER_SITE_OTHER): Payer: Medicare Other | Admitting: Vascular Surgery

## 2018-10-23 ENCOUNTER — Encounter (INDEPENDENT_AMBULATORY_CARE_PROVIDER_SITE_OTHER): Payer: Self-pay | Admitting: Vascular Surgery

## 2018-10-23 ENCOUNTER — Other Ambulatory Visit: Payer: Self-pay | Admitting: Family Medicine

## 2018-10-23 ENCOUNTER — Other Ambulatory Visit: Payer: Self-pay

## 2018-10-23 VITALS — BP 115/68 | HR 72 | Resp 16 | Ht 73.0 in | Wt 264.0 lb

## 2018-10-23 VITALS — BP 132/82 | HR 89 | Resp 16 | Ht 73.0 in | Wt 265.0 lb

## 2018-10-23 DIAGNOSIS — Z0000 Encounter for general adult medical examination without abnormal findings: Secondary | ICD-10-CM

## 2018-10-23 DIAGNOSIS — M79606 Pain in leg, unspecified: Secondary | ICD-10-CM | POA: Insufficient documentation

## 2018-10-23 DIAGNOSIS — M79605 Pain in left leg: Secondary | ICD-10-CM

## 2018-10-23 DIAGNOSIS — M79604 Pain in right leg: Secondary | ICD-10-CM | POA: Diagnosis not present

## 2018-10-23 DIAGNOSIS — R8290 Unspecified abnormal findings in urine: Secondary | ICD-10-CM

## 2018-10-23 DIAGNOSIS — I10 Essential (primary) hypertension: Secondary | ICD-10-CM

## 2018-10-23 DIAGNOSIS — Z791 Long term (current) use of non-steroidal anti-inflammatories (NSAID): Secondary | ICD-10-CM

## 2018-10-23 DIAGNOSIS — M150 Primary generalized (osteo)arthritis: Secondary | ICD-10-CM

## 2018-10-23 DIAGNOSIS — I739 Peripheral vascular disease, unspecified: Secondary | ICD-10-CM

## 2018-10-23 DIAGNOSIS — I482 Chronic atrial fibrillation, unspecified: Secondary | ICD-10-CM

## 2018-10-23 DIAGNOSIS — M159 Polyosteoarthritis, unspecified: Secondary | ICD-10-CM

## 2018-10-23 DIAGNOSIS — G4733 Obstructive sleep apnea (adult) (pediatric): Secondary | ICD-10-CM | POA: Diagnosis not present

## 2018-10-23 DIAGNOSIS — L309 Dermatitis, unspecified: Secondary | ICD-10-CM | POA: Insufficient documentation

## 2018-10-23 DIAGNOSIS — Z79899 Other long term (current) drug therapy: Secondary | ICD-10-CM

## 2018-10-23 DIAGNOSIS — M1990 Unspecified osteoarthritis, unspecified site: Secondary | ICD-10-CM | POA: Insufficient documentation

## 2018-10-23 DIAGNOSIS — Z9989 Dependence on other enabling machines and devices: Secondary | ICD-10-CM

## 2018-10-23 LAB — POCT URINALYSIS DIPSTICK
Bilirubin, UA: NEGATIVE
Blood, UA: NEGATIVE
Glucose, UA: NEGATIVE
Ketones, UA: NEGATIVE
Nitrite, UA: POSITIVE
Protein, UA: NEGATIVE
Spec Grav, UA: 1.01 (ref 1.010–1.025)
Urobilinogen, UA: 0.2 E.U./dL
pH, UA: 6 (ref 5.0–8.0)

## 2018-10-23 MED ORDER — OLMESARTAN MEDOXOMIL 40 MG PO TABS: 40.0000 mg | ORAL_TABLET | Freq: Every day | ORAL | 3 refills | Status: DC

## 2018-10-23 NOTE — Patient Instructions (Signed)
This information is directly available on the CDC website: https://www.cdc.gov/coronavirus/2019-ncov/if-you-are-sick/steps-when-sick.html    Source:CDC Reference to specific commercial products, manufacturers, companies, or trademarks does not constitute its endorsement or recommendation by the U.S. Government, Department of Health and Human Services, or Centers for Disease Control and Prevention.  

## 2018-10-23 NOTE — Progress Notes (Signed)
MRN : [id number]  Daniel Brandt is a 85 y.o. (1933-09-22) male who presents with chief complaint of  Chief Complaint  Patient presents with  . Follow-up  .  History of Present Illness:   The patient is seen for evaluation of numbness and weakness of the lower extremities. Patient notes the numbness is variable and not always associated with activity.  The numbness is somewhat consistent day to day occurring on most days. The patient notes the numbness also occurs with standing and routinely seems worse as the day wears on. Standing makes his symptoms worse.  The numbness has been progressive over the past several years. The patient states these symptoms are causing  a profound negative impact on quality of life and daily activities.  The patient denies rest pain or dangling of an extremity off the side of the bed during the night for relief. No open wounds or sores at this time. No history of DVT or phlebitis. No prior interventions or surgeries.  There is an extensive  history of back problems and DJD of the lumbar and sacral spine.  He is s/p Lumbar fusion.  He is also s/p bilateral knee replacements    Current Meds  Medication Sig  . aspirin 81 MG tablet Take 1 tablet (81 mg total) by mouth daily.  . metoprolol succinate (TOPROL-XL) 100 MG 24 hr tablet Take 1 tablet (100 mg total) by mouth daily. Take with or immediately following a meal.  . Multiple Vitamins-Minerals (MULTIVITAMIN WITH MINERALS) tablet Take 1 tablet by mouth daily.  . NON FORMULARY CPAP nightly  . olmesartan (BENICAR) 40 MG tablet Take 1 tablet (40 mg total) by mouth daily.  . Omega-3 Fatty Acids (FISH OIL) 1000 MG CAPS Take 2 capsules by mouth 2 (two) times daily.  . vitamin B-12 (CYANOCOBALAMIN) 500 MCG tablet Take 500 mcg by mouth daily.    Past Medical History:  Diagnosis Date  . A-fib ([place])   . Arthritis   . FH: heart failure 10/04/2016  . Former smoker   . Hypertension   . Malaria   . Stroke  [facility])     Past Surgical History:  Procedure Laterality Date  . COLONOSCOPY  06/12/2016  . FRACTURE SURGERY    . KNEE REPLACE Bilateral 2017  . LUMBAR FUSION  1995   L3 L4    Social History Social History   Tobacco Use  . Smoking status: Former Smoker    Packs/day: 1.00    Years: 0.50    Pack years: 0.50    Types: Cigarettes    Last attempt to quit: 10/04/1956    Years since quitting: 62.0  . Smokeless tobacco: Never Used  . Tobacco comment: smoking cessation materials not required  Substance Use Topics  . Alcohol use: Yes    Alcohol/week: 28.0 standard drinks    Types: 28 Shots of liquor per week    Comment: Scotch  . Drug use: No    Family History Family History  Problem Relation Age of Onset  . Heart disease Mother   . Heart disease Father   . Gout Sister   . Heart attack Son     No Known Allergies   REVIEW OF SYSTEMS (Negative unless checked)  Constitutional: [] Weight loss  [] Fever  [] Chills Cardiac: [] Chest pain   [] Chest pressure   [] Palpitations   [] Shortness of breath when laying flat   [] Shortness of breath with exertion. Vascular:  [] Pain in legs with walking   [] Pain in  legs at rest  [] History of DVT   [] Phlebitis   [] Swelling in legs   [] Varicose veins   [] Non-healing ulcers Pulmonary:   [] Uses home oxygen   [] Productive cough   [] Hemoptysis   [] Wheeze  [] COPD   [] Asthma Neurologic:  [] Dizziness   [] Seizures   [] History of stroke   [] History of TIA  [] Aphasia   [] Vissual changes   [] Weakness or numbness in arm   [] Weakness or numbness in leg Musculoskeletal:   [x] Joint swelling   [x] Joint pain   [x] Low back pain Hematologic:  [] Easy bruising  [] Easy bleeding   [] Hypercoagulable state   [] Anemic Gastrointestinal:  [] Diarrhea   [] Vomiting  [] Gastroesophageal reflux/heartburn   [] Difficulty swallowing. Genitourinary:  [] Chronic kidney disease   [] Difficult urination  [] Frequent urination   [] Blood in urine Skin:  [] Rashes   [] Ulcers  Psychological:   [] History of anxiety   []  History of major depression.  Physical Examination  Vitals:   10/23/18 1329  BP: 115/68  Pulse: 72  Resp: 16  Weight: 264 lb (119.7 kg)  Height: 6\' 1"  (1.854 m)   Body mass index is 34.83 kg/m. Gen: WD/WN, NAD Head: [place]/AT, No temporalis wasting.  Ear/Nose/Throat: Hearing grossly intact, nares w/o erythema or drainage Eyes: PER, EOMI, sclera nonicteric.  Neck: Supple, no large masses.   Pulmonary:  Good air movement, no audible wheezing bilaterally, no use of accessory muscles.  Cardiac: RRR, no JVD Vascular: scattered varicosities present bilaterally.  Moderate venous stasis changes to the legs bilaterally.  2-3+ soft pitting edema Vessel Right Left  Radial Palpable Palpable  PT Palpable Palpable  DP Palpable Palpable  Gastrointestinal: Non-distended. No guarding/no peritoneal signs.  Musculoskeletal: M/S 5/5 throughout.  No deformity or atrophy.  Neurologic: CN 2-12 intact. Symmetrical.  Speech is fluent. Motor exam as listed above. Psychiatric: Judgment intact, Mood & affect appropriate for pt's clinical situation. Dermatologic: No rashes or ulcers noted.  No changes consistent with cellulitis. Lymph : No lichenification or skin changes of chronic lymphedema.  CBC Lab Results  Component Value Date   WBC 4.1 09/10/2018   HGB 13.9 09/10/2018   HCT 40.3 09/10/2018   MCV 104 (H) 09/10/2018   PLT 188 09/10/2018    BMET    Component Value Date/Time   NA 143 09/10/2018 0819   K 4.7 09/10/2018 0819   CL 102 09/10/2018 0819   CO2 21 09/10/2018 0819   GLUCOSE 94 09/10/2018 0819   BUN 15 09/10/2018 0819   CREATININE 1.18 09/10/2018 0819   CALCIUM 9.3 09/10/2018 0819   GFRNONAA 57 (L) 09/10/2018 0819   GFRAA 66 09/10/2018 0819   CrCl cannot be calculated (Patient's most recent lab result is older than the maximum 21 days allowed.).  COAG No results found for: INR, PROTIME  Radiology No results found.   Assessment/Plan 1. Pain in both  lower extremities Recommend:  I do not find evidence of Vascular pathology that would explain the patient's symptoms  The patient has atypical pain symptoms for vascular disease  Noninvasive studies including venous ultrasound of the legs do not identify vascular problems  The patient should continue walking and begin a more formal exercise program. The patient should continue his antiplatelet therapy and aggressive treatment of the lipid abnormalities. The patient should begin wearing graduated compression socks 15-20 mmHg strength to control her mild edema.  Patient will follow-up with me on a PRN basis  Further work-up of her lower extremity pain is deferred to the primary service  2. Primary osteoarthritis involving multiple joints Continue NSAID medications as already ordered, these medications have been reviewed and there are no changes at this time.  Continued activity and therapy was stressed.   3. PVD (peripheral vascular disease) ([place]) Recommend:  I do not find evidence of life style limiting vascular disease. The patient specifically denies life style limitation.  Previous noninvasive studies including ABI's of the legs do not identify critical vascular problems.  The patient should continue walking and begin a more formal exercise program. The patient should continue his antiplatelet therapy and aggressive treatment of the lipid abnormalities.  The patient should begin wearing graduated compression socks 15-20 mmHg strength to control her mild edema.  Patient will follow-up with me on a PRN basis   4. Chronic atrial fibrillation Continue antiarrhythmia medications as already ordered, these medications have been reviewed and there are no changes at this time.  Continue anticoagulation as ordered by Cardiology Service   5. Essential hypertension Continue antihypertensive medications as already ordered, these medications have been reviewed and there are no  changes at this time.     Hortencia Pilar, MD  10/23/2018 2:52 PM

## 2018-10-23 NOTE — Progress Notes (Signed)
Date:  10/23/2018   Name:  Daniel Brandt   DOB:  [birth year]   MRN:  [id number]   Chief Complaint: Annual Exam Daniel Brandt is a 85 y.o. male who presents today for his Complete Annual Exam. He feels well. He reports exercising some. He reports he is sleeping fairly well. He continues to consume 2 double scotches per night which he has done for many years.  Immunizations are up to date PSA, colonoscopy no longer recommended for screening  Hypertension  This is a chronic problem. The problem is controlled. Pertinent negatives include no chest pain, headaches, palpitations or shortness of breath. Past treatments include beta blockers and angiotensin blockers. The current treatment provides significant improvement.  Heart Problem  This is a chronic (atrial fibrillation) problem. The current episode started more than 1 year ago. Associated symptoms include arthralgias and numbness (tingling in feet). Pertinent negatives include no abdominal pain, chest pain, chills, diaphoresis, fatigue, headaches, myalgias or rash. Treatments tried: aspirin only anticoagulation and metoprolol for rate control.  PVD - had abnormal ABI and is now being referred to [facility].  He has tingling and some intermittent foot pain for year which is not worsening.  CPAP - using CPAP nightly with good benefit, sleeps well with no am headaches, daytime sleepiness.  Review of Systems  Constitutional: Negative for appetite change, chills, diaphoresis, fatigue and unexpected weight change.  HENT: Negative for hearing loss, tinnitus, trouble swallowing and voice change.   Eyes: Negative for visual disturbance.  Respiratory: Negative for choking, shortness of breath and wheezing.   Cardiovascular: Negative for chest pain, palpitations and leg swelling.  Gastrointestinal: Negative for abdominal pain, blood in stool, constipation and diarrhea.  Endocrine: Negative for polydipsia and polyuria.  Genitourinary: Positive for  frequency. Negative for difficulty urinating, dysuria, hematuria and urgency.  Musculoskeletal: Positive for arthralgias. Negative for back pain and myalgias.  Skin: Negative for color change and rash.  Allergic/Immunologic: Positive for environmental allergies.  Neurological: Positive for numbness (tingling in feet). Negative for dizziness, syncope and headaches.  Hematological: Negative for adenopathy.  Psychiatric/Behavioral: Negative for dysphoric mood and sleep disturbance.    Patient Active Problem List   Diagnosis Date Noted  . Eczema of lower leg 10/23/2018  . Plantar fasciitis of left foot 05/19/2018  . Peripheral neuropathy, idiopathic 04/23/2018  . Environmental and seasonal allergies 04/23/2018  . OSA on CPAP 10/09/2017  . Ganglion cyst of finger of left hand 12/04/2016  . Chronic atrial fibrillation 10/04/2016  . PVD (peripheral vascular disease) ([facility]) 10/04/2016  . Essential hypertension 03/09/2016  . Osteoarthritis of both knees 03/09/2016  . Psoriasis 03/09/2016    No Known Allergies  Past Surgical History:  Procedure Laterality Date  . COLONOSCOPY  06/12/2016  . FRACTURE SURGERY    . KNEE REPLACE Bilateral 2017  . LUMBAR FUSION  1995   L3 L4    Social History   Tobacco Use  . Smoking status: Former Smoker    Packs/day: 1.00    Years: 0.50    Pack years: 0.50    Types: Cigarettes    Last attempt to quit: 10/04/1956    Years since quitting: 62.0  . Smokeless tobacco: Never Used  . Tobacco comment: smoking cessation materials not required  Substance Use Topics  . Alcohol use: Yes    Comment: 1750 ML DAY OF SCOTCH  . Drug use: No     Medication list has been reviewed and updated.  Current Meds  Medication Sig  .  aspirin 81 MG tablet Take 1 tablet (81 mg total) by mouth daily.  . metoprolol succinate (TOPROL-XL) 100 MG 24 hr tablet Take 1 tablet (100 mg total) by mouth daily. Take with or immediately following a meal.  . Multiple  Vitamins-Minerals (MULTIVITAMIN WITH MINERALS) tablet Take 1 tablet by mouth daily.  Marland Kitchen olmesartan (BENICAR) 40 MG tablet TAKE 1 TABLET BY MOUTH ONCE DAILY  . Omega-3 Fatty Acids (FISH OIL) 1000 MG CAPS Take 2 capsules by mouth 2 (two) times daily.  . vitamin B-12 (CYANOCOBALAMIN) 500 MCG tablet Take 500 mcg by mouth daily.    PHQ 2/9 Scores 10/23/2018 09/04/2018 04/02/2018 04/01/2017  PHQ - 2 Score 0 0 0 0  PHQ- 9 Score 0 - 0 -    BP Readings from Last 3 Encounters:  10/23/18 132/82  09/04/18 118/76  05/19/18 117/78    Physical Exam Vitals signs and nursing note reviewed.  Constitutional:      Appearance: Normal appearance. He is well-developed.  HENT:     Head: Normocephalic.     Right Ear: Tympanic membrane, ear canal and external ear normal.     Left Ear: Tympanic membrane, ear canal and external ear normal.     Nose: Nose normal.     Mouth/Throat:     Pharynx: Uvula midline.  Eyes:     Conjunctiva/sclera: Conjunctivae normal.     Pupils: Pupils are equal, round, and reactive to light.  Neck:     Musculoskeletal: Normal range of motion and neck supple.     Thyroid: No thyromegaly.     Vascular: No carotid bruit.  Cardiovascular:     Rate and Rhythm: Normal rate. Rhythm regularly irregular.     Pulses:          Dorsalis pedis pulses are 1+ on the right side and 1+ on the left side.       Posterior tibial pulses are 0 on the right side and 0 on the left side.     Heart sounds: Normal heart sounds. No murmur.  Pulmonary:     Effort: Pulmonary effort is normal.     Breath sounds: Normal breath sounds. No decreased breath sounds, wheezing or rhonchi.  Chest:     Breasts:        Right: No mass.        Left: No mass.  Abdominal:     General: Bowel sounds are normal.     Palpations: Abdomen is soft.     Tenderness: There is no abdominal tenderness.     Hernia: A hernia is present. Hernia is present in the umbilical area (1 cm non tender, reducible).  Musculoskeletal:  Normal range of motion.     Right lower leg: Edema (trace) present.     Left lower leg: Left lower leg edema: trace.  Lymphadenopathy:     Cervical: No cervical adenopathy.  Skin:    General: Skin is warm and dry.  Neurological:     Mental Status: He is alert and oriented to person, place, and time.     Deep Tendon Reflexes: Reflexes are normal and symmetric.  Psychiatric:        Attention and Perception: Attention normal.        Mood and Affect: Mood normal.        Speech: Speech normal.        Behavior: Behavior normal.        Thought Content: Thought content normal.  Judgment: Judgment normal.     Wt Readings from Last 3 Encounters:  10/23/18 265 lb (120.2 kg)  09/04/18 260 lb (117.9 kg)  05/19/18 257 lb (116.6 kg)    BP 132/82   Pulse 89   Resp 16   Ht 6\' 1"  (1.854 m)   Wt 265 lb (120.2 kg)   SpO2 98%   BMI 34.96 kg/m   Assessment and Plan: 1. Annual physical exam Normal exam except for weight Discussed recommendations of daily limits of alcohol intake - POCT urinalysis dipstick - 4+ bacteria will send for culture  2. Essential hypertension controlled - CBC with Differential/Platelet - TSH  3. Chronic atrial fibrillation Chronic, rate controlled on metoprolol Anticoagulated on aspirin - Comprehensive metabolic panel  4. PVD (peripheral vascular disease) (H[facility]) Reduced pulses in LEs with sx - await formal evaluation by [facility] - Lipid panel  5. OSA on CPAP Doing well with excellent compliance and response to treatment   Partially dictated using Editor, commissioning. Any errors are unintentional.  Halina Maidens, MD [facility]  10/23/2018

## 2018-10-24 LAB — CBC WITH DIFFERENTIAL/PLATELET
Basophils Absolute: 0 10*3/uL (ref 0.0–0.2)
Basos: 0 %
EOS (ABSOLUTE): 0.2 10*3/uL (ref 0.0–0.4)
Eos: 3 %
Hematocrit: 42.1 % (ref 37.5–51.0)
Hemoglobin: 15 g/dL (ref 13.0–17.7)
Immature Grans (Abs): 0 10*3/uL (ref 0.0–0.1)
Immature Granulocytes: 1 %
Lymphocytes Absolute: 2 10*3/uL (ref 0.7–3.1)
Lymphs: 35 %
MCH: 36.3 pg — ABNORMAL HIGH (ref 26.6–33.0)
MCHC: 35.6 g/dL (ref 31.5–35.7)
MCV: 102 fL — ABNORMAL HIGH (ref 79–97)
Monocytes Absolute: 0.5 10*3/uL (ref 0.1–0.9)
Monocytes: 9 %
Neutrophils Absolute: 2.9 10*3/uL (ref 1.4–7.0)
Neutrophils: 52 %
Platelets: 222 10*3/uL (ref 150–450)
RBC: 4.13 x10E6/uL — ABNORMAL LOW (ref 4.14–5.80)
RDW: 12.9 % (ref 11.6–15.4)
WBC: 5.6 10*3/uL (ref 3.4–10.8)

## 2018-10-24 LAB — LIPID PANEL
Chol/HDL Ratio: 3.7 ratio (ref 0.0–5.0)
Cholesterol, Total: 166 mg/dL (ref 100–199)
HDL: 45 mg/dL (ref >39)
LDL Calculated: 82 mg/dL (ref 0–99)
Triglycerides: 196 mg/dL — ABNORMAL HIGH (ref 0–149)
VLDL Cholesterol Cal: 39 mg/dL (ref 5–40)

## 2018-10-24 LAB — COMPREHENSIVE METABOLIC PANEL
ALT: 31 IU/L (ref 0–44)
AST: 26 IU/L (ref 0–40)
Albumin/Globulin Ratio: 2.1 (ref 1.2–2.2)
Albumin: 4.7 g/dL — ABNORMAL HIGH (ref 3.6–4.6)
Alkaline Phosphatase: 60 IU/L (ref 39–117)
BUN/Creatinine Ratio: 15 (ref 10–24)
BUN: 18 mg/dL (ref 8–27)
Bilirubin Total: 0.5 mg/dL (ref 0.0–1.2)
CO2: 22 mmol/L (ref 20–29)
Calcium: 8.9 mg/dL (ref 8.6–10.2)
Chloride: 102 mmol/L (ref 96–106)
Creatinine, Ser: 1.21 mg/dL (ref 0.76–1.27)
GFR calc Af Amer: 64 mL/min/{1.73_m2} (ref >59)
GFR calc non Af Amer: 55 mL/min/{1.73_m2} — ABNORMAL LOW (ref >59)
Globulin, Total: 2.2 g/dL (ref 1.5–4.5)
Glucose: 97 mg/dL (ref 65–99)
Potassium: 4.6 mmol/L (ref 3.5–5.2)
Sodium: 140 mmol/L (ref 134–144)
Total Protein: 6.9 g/dL (ref 6.0–8.5)

## 2018-10-24 LAB — TSH: TSH: 5.44 u[IU]/mL — ABNORMAL HIGH (ref 0.450–4.500)

## 2018-10-25 LAB — URINE CULTURE

## 2018-10-27 ENCOUNTER — Other Ambulatory Visit: Payer: Self-pay | Admitting: Internal Medicine

## 2018-10-27 DIAGNOSIS — N3000 Acute cystitis without hematuria: Secondary | ICD-10-CM

## 2018-10-27 MED ORDER — CEPHALEXIN 500 MG PO CAPS: 500.0000 mg | ORAL_CAPSULE | Freq: Four times a day (QID) | ORAL | 0 refills | Status: AC

## 2018-11-27 ENCOUNTER — Telehealth (INDEPENDENT_AMBULATORY_CARE_PROVIDER_SITE_OTHER): Payer: Self-pay | Admitting: Vascular Surgery

## 2018-11-27 NOTE — Addendum Note (Signed)
Addended by: Katha Cabal on: 11/27/2018 11:00 AM   Modules accepted: Orders

## 2018-11-27 NOTE — Telephone Encounter (Signed)
Pt called our office Tuesday to ask about referral for neuro surgery.... spoke with schnier and he said that the referral was put it but re-entered it. Patient is aware of this. Patient being referred to Dr. Toula Moos. AS, CMA

## 2018-12-11 DIAGNOSIS — M545 Low back pain: Secondary | ICD-10-CM | POA: Diagnosis not present

## 2018-12-11 DIAGNOSIS — M79604 Pain in right leg: Secondary | ICD-10-CM | POA: Diagnosis not present

## 2018-12-11 DIAGNOSIS — Z981 Arthrodesis status: Secondary | ICD-10-CM | POA: Diagnosis not present

## 2018-12-11 DIAGNOSIS — M79605 Pain in left leg: Secondary | ICD-10-CM | POA: Diagnosis not present

## 2018-12-11 DIAGNOSIS — M4306 Spondylolysis, lumbar region: Secondary | ICD-10-CM | POA: Diagnosis not present

## 2018-12-11 DIAGNOSIS — G8929 Other chronic pain: Secondary | ICD-10-CM | POA: Diagnosis not present

## 2018-12-12 ENCOUNTER — Other Ambulatory Visit: Payer: Self-pay | Admitting: Student

## 2018-12-12 DIAGNOSIS — G8929 Other chronic pain: Secondary | ICD-10-CM

## 2018-12-12 DIAGNOSIS — M545 Low back pain, unspecified: Secondary | ICD-10-CM

## 2018-12-24 ENCOUNTER — Other Ambulatory Visit: Payer: Self-pay

## 2018-12-24 ENCOUNTER — Ambulatory Visit
Admission: RE | Admit: 2018-12-24 | Discharge: 2018-12-24 | Disposition: A | Discharge status: Home / Self Care | Payer: Medicare Other | Source: Ambulatory Visit | Attending: Student | Admitting: Student

## 2018-12-24 DIAGNOSIS — G8929 Other chronic pain: Secondary | ICD-10-CM | POA: Diagnosis not present

## 2018-12-24 DIAGNOSIS — M48061 Spinal stenosis, lumbar region without neurogenic claudication: Secondary | ICD-10-CM | POA: Diagnosis not present

## 2018-12-24 DIAGNOSIS — M545 Low back pain, unspecified: Secondary | ICD-10-CM

## 2018-12-24 DIAGNOSIS — M5126 Other intervertebral disc displacement, lumbar region: Secondary | ICD-10-CM | POA: Diagnosis not present

## 2019-01-13 DIAGNOSIS — M79605 Pain in left leg: Secondary | ICD-10-CM | POA: Diagnosis not present

## 2019-01-13 DIAGNOSIS — M6281 Muscle weakness (generalized): Secondary | ICD-10-CM | POA: Diagnosis not present

## 2019-01-13 DIAGNOSIS — M79604 Pain in right leg: Secondary | ICD-10-CM | POA: Diagnosis not present

## 2019-01-21 DIAGNOSIS — M79604 Pain in right leg: Secondary | ICD-10-CM | POA: Diagnosis not present

## 2019-01-21 DIAGNOSIS — M6281 Muscle weakness (generalized): Secondary | ICD-10-CM | POA: Diagnosis not present

## 2019-01-21 DIAGNOSIS — M79605 Pain in left leg: Secondary | ICD-10-CM | POA: Diagnosis not present

## 2019-01-27 DIAGNOSIS — I10 Essential (primary) hypertension: Secondary | ICD-10-CM | POA: Diagnosis not present

## 2019-01-27 DIAGNOSIS — G4733 Obstructive sleep apnea (adult) (pediatric): Secondary | ICD-10-CM | POA: Diagnosis not present

## 2019-01-27 DIAGNOSIS — Z9989 Dependence on other enabling machines and devices: Secondary | ICD-10-CM | POA: Diagnosis not present

## 2019-01-27 DIAGNOSIS — I482 Chronic atrial fibrillation, unspecified: Secondary | ICD-10-CM | POA: Diagnosis not present

## 2019-02-03 DIAGNOSIS — M48062 Spinal stenosis, lumbar region with neurogenic claudication: Secondary | ICD-10-CM | POA: Diagnosis not present

## 2019-02-03 DIAGNOSIS — M5136 Other intervertebral disc degeneration, lumbar region: Secondary | ICD-10-CM | POA: Diagnosis not present

## 2019-02-03 DIAGNOSIS — M5416 Radiculopathy, lumbar region: Secondary | ICD-10-CM | POA: Diagnosis not present

## 2019-02-24 DIAGNOSIS — M5416 Radiculopathy, lumbar region: Secondary | ICD-10-CM | POA: Diagnosis not present

## 2019-02-24 DIAGNOSIS — M5136 Other intervertebral disc degeneration, lumbar region: Secondary | ICD-10-CM | POA: Diagnosis not present

## 2019-02-24 DIAGNOSIS — M48062 Spinal stenosis, lumbar region with neurogenic claudication: Secondary | ICD-10-CM | POA: Diagnosis not present

## 2019-03-18 DIAGNOSIS — M5136 Other intervertebral disc degeneration, lumbar region: Secondary | ICD-10-CM | POA: Diagnosis not present

## 2019-03-18 DIAGNOSIS — M48062 Spinal stenosis, lumbar region with neurogenic claudication: Secondary | ICD-10-CM | POA: Diagnosis not present

## 2019-03-18 DIAGNOSIS — M5416 Radiculopathy, lumbar region: Secondary | ICD-10-CM | POA: Diagnosis not present

## 2019-03-26 ENCOUNTER — Other Ambulatory Visit: Payer: Self-pay | Admitting: Internal Medicine

## 2019-05-18 ENCOUNTER — Ambulatory Visit: Payer: Medicare Other

## 2019-07-17 ENCOUNTER — Other Ambulatory Visit: Payer: Self-pay | Admitting: Internal Medicine

## 2019-08-06 DIAGNOSIS — M5416 Radiculopathy, lumbar region: Secondary | ICD-10-CM | POA: Diagnosis not present

## 2019-08-06 DIAGNOSIS — M5136 Other intervertebral disc degeneration, lumbar region: Secondary | ICD-10-CM | POA: Diagnosis not present

## 2019-08-06 DIAGNOSIS — M48062 Spinal stenosis, lumbar region with neurogenic claudication: Secondary | ICD-10-CM | POA: Diagnosis not present

## 2019-08-19 DIAGNOSIS — I10 Essential (primary) hypertension: Secondary | ICD-10-CM | POA: Diagnosis not present

## 2019-08-19 DIAGNOSIS — I482 Chronic atrial fibrillation, unspecified: Secondary | ICD-10-CM | POA: Diagnosis not present

## 2019-08-19 DIAGNOSIS — G4733 Obstructive sleep apnea (adult) (pediatric): Secondary | ICD-10-CM | POA: Diagnosis not present

## 2019-08-19 DIAGNOSIS — I739 Peripheral vascular disease, unspecified: Secondary | ICD-10-CM | POA: Diagnosis not present

## 2019-09-23 ENCOUNTER — Ambulatory Visit: Payer: Medicare Other | Admitting: Family Medicine

## 2019-10-13 ENCOUNTER — Other Ambulatory Visit: Payer: Self-pay | Admitting: Family Medicine

## 2019-10-13 ENCOUNTER — Encounter: Payer: Self-pay | Admitting: Family Medicine

## 2019-10-13 ENCOUNTER — Other Ambulatory Visit: Payer: Self-pay

## 2019-10-13 ENCOUNTER — Ambulatory Visit (INDEPENDENT_AMBULATORY_CARE_PROVIDER_SITE_OTHER): Payer: Medicare Other | Admitting: Family Medicine

## 2019-10-13 VITALS — BP 145/74 | HR 71 | Temp 97.1°F | Resp 16 | Ht 74.0 in | Wt 255.4 lb

## 2019-10-13 DIAGNOSIS — Z7689 Persons encountering health services in other specified circumstances: Secondary | ICD-10-CM | POA: Diagnosis not present

## 2019-10-13 DIAGNOSIS — M5136 Other intervertebral disc degeneration, lumbar region: Secondary | ICD-10-CM

## 2019-10-13 DIAGNOSIS — I129 Hypertensive chronic kidney disease with stage 1 through stage 4 chronic kidney disease, or unspecified chronic kidney disease: Secondary | ICD-10-CM

## 2019-10-13 DIAGNOSIS — I482 Chronic atrial fibrillation, unspecified: Secondary | ICD-10-CM

## 2019-10-13 DIAGNOSIS — I10 Essential (primary) hypertension: Secondary | ICD-10-CM

## 2019-10-13 DIAGNOSIS — M48061 Spinal stenosis, lumbar region without neurogenic claudication: Secondary | ICD-10-CM | POA: Diagnosis not present

## 2019-10-13 DIAGNOSIS — R7309 Other abnormal glucose: Secondary | ICD-10-CM

## 2019-10-13 DIAGNOSIS — R351 Nocturia: Secondary | ICD-10-CM

## 2019-10-13 DIAGNOSIS — I739 Peripheral vascular disease, unspecified: Secondary | ICD-10-CM

## 2019-10-13 DIAGNOSIS — E781 Pure hyperglyceridemia: Secondary | ICD-10-CM

## 2019-10-13 DIAGNOSIS — N183 Chronic kidney disease, stage 3 unspecified: Secondary | ICD-10-CM

## 2019-10-13 DIAGNOSIS — Z0000 Encounter for general adult medical examination without abnormal findings: Secondary | ICD-10-CM

## 2019-10-13 DIAGNOSIS — M51369 Other intervertebral disc degeneration, lumbar region without mention of lumbar back pain or lower extremity pain: Secondary | ICD-10-CM | POA: Insufficient documentation

## 2019-10-13 MED ORDER — METOPROLOL SUCCINATE ER 100 MG PO TB24: 100.0000 mg | ORAL_TABLET | Freq: Every day | ORAL | 3 refills | Status: DC

## 2019-10-13 NOTE — Progress Notes (Signed)
Subjective:    Patient ID: Daniel Brandt, male    DOB: 08-04-1933, 86 y.o.   MRN: [id number]  Daniel Brandt is a 86 y.o. male presenting on 10/13/2019 for Establish Care (COVID-19 antibody test wants to travel [place]), Atrial Fibrillation, and Hypertension  Previous PCP Dr Halina Maidens in [place], now he has relocated to [facility] area, looking for new PCP.  Here with wife Ines.  HPI   Additional background history From [place], played Rugby, did have many injuries in past He lived with wife more recently in [place] for 10+ years, had been in [place] for 3 years, now located in [place]  Chronic Back Pain / Lumbar DDD with spinal stenosis History of prior back fracture. 1995 had lumbar back surgery. Had complication 1 year ago, and was offered repeat surgeon  - [facility]ed by [facility] - followed by Dr Sharlet Salina and Allene Dillon NP, they are doing  - He has difficulty with numbness in lower extremity and some weakness, now he says pain in back is more dull and less often. He has improved sensation in Right lower leg now. - Prior knee replacement surgery, other injuries.  Atrial Fibrillation / CHRONIC HTN: Reports no new concerns. No episodic AFib problems. Followed by Dr Ubaldo Glassing [facility] Cardiology) Current Meds - Metoprolol XL 100mg , Olmesartan 40mg  daily Reports good compliance, took meds today. Tolerating well, w/o complaints. Denies CP, dyspnea, HA, edema, dizziness / lightheadedness  Additional history  Travel upcoming to [place] for 35 yr wedding anniversary, leaving at end of April 2021. Asking about COVID guidelines for travel. Needs PCR test negative 1-3 days prior to leaving. Already had 1 of 2 doses covid vaccine but declines to get 2nd dose.    Depression screen [facility] 2/9 10/13/2019 10/23/2018 09/04/2018  Decreased Interest 0 0 0  Down, Depressed, Hopeless 0 0 0  PHQ - 2 Score 0 0 0  Altered sleeping - 0 -  Tired, decreased energy - 0 -  Change in  appetite - 0 -  Feeling bad or failure about yourself  - 0 -  Trouble concentrating - 0 -  Moving slowly or fidgety/restless - 0 -  Suicidal thoughts - 0 -  PHQ-9 Score - 0 -  Difficult doing work/chores - - -    Past Medical History:  Diagnosis Date  . A-fib ([place])   . Arthritis   . FH: heart failure 10/04/2016  . Former smoker   . Heart murmur   . Hypertension   . Malaria   . Stroke [facility])    Past Surgical History:  Procedure Laterality Date  . COLONOSCOPY  06/12/2016  . FRACTURE SURGERY    . KNEE REPLACE Bilateral 2017  . LUMBAR FUSION  1995   L3 L4   Social History   Socioeconomic History  . Marital status: Married    Spouse name: Not on file  . Number of children: 6  . Years of education: Not on file  . Highest education level: Associate degree: academic program  Occupational History  . Occupation: Retired  Tobacco Use  . Smoking status: Former Smoker    Packs/day: 1.00    Years: 0.50    Pack years: 0.50    Types: Cigarettes    Quit date: 10/04/1956    Years since quitting: 63.0  . Smokeless tobacco: Never Used  . Tobacco comment: smoking cessation materials not required  Substance and Sexual Activity  . Alcohol use: Yes    Alcohol/week: 28.0 standard  drinks    Types: 28 Shots of liquor per week    Comment: Scotch  . Drug use: No  . Sexual activity: Not Currently  Other Topics Concern  . Not on file  Social History Narrative  . Not on file   Social Determinants of Health   Financial Resource Strain:   . Difficulty of Paying Living Expenses:   Food Insecurity:   . Worried About Charity fundraiser in the Last Year:   . Arboriculturist in the Last Year:   Transportation Needs:   . Film/video editor (Medical):   Marland Kitchen Lack of Transportation (Non-Medical):   Physical Activity:   . Days of Exercise per Week:   . Minutes of Exercise per Session:   Stress:   . Feeling of Stress :   Social Connections:   . Frequency of Communication with Friends  and Family:   . Frequency of Social Gatherings with Friends and Family:   . Attends Religious Services:   . Active Member of Clubs or Organizations:   . Attends Archivist Meetings:   Marland Kitchen Marital Status:   Intimate Partner Violence:   . Fear of Current or Ex-Partner:   . Emotionally Abused:   Marland Kitchen Physically Abused:   . Sexually Abused:    Family History  Problem Relation Age of Onset  . Heart disease Mother   . Heart disease Father   . Gout Sister   . Heart attack Son    Current Outpatient Medications on File Prior to Visit  Medication Sig  . aspirin 81 MG tablet Take 1 tablet (81 mg total) by mouth daily.  . Multiple Vitamins-Minerals (MULTIVITAMIN WITH MINERALS) tablet Take 1 tablet by mouth daily.  . NON FORMULARY CPAP nightly  . olmesartan (BENICAR) 40 MG tablet TAKE 1 TABLET BY MOUTH ONCE DAILY  . Omega-3 Fatty Acids (FISH OIL) 1000 MG CAPS Take 2 capsules by mouth 2 (two) times daily.  . vitamin B-12 (CYANOCOBALAMIN) 500 MCG tablet Take 500 mcg by mouth daily.   No current facility-administered medications on file prior to visit.    Review of Systems Per HPI unless specifically indicated above      Objective:    BP (!) 145/74   Pulse 71   Temp (!) 97.1 F (36.2 C) (Temporal)   Resp 16   Ht 6\' 2"  (1.88 m)   Wt 255 lb 6.4 oz (115.8 kg)   SpO2 99%   BMI 32.79 kg/m   Wt Readings from Last 3 Encounters:  10/13/19 255 lb 6.4 oz (115.8 kg)  10/23/18 264 lb (119.7 kg)  10/23/18 265 lb (120.2 kg)    Physical Exam Vitals and nursing note reviewed.  Constitutional:      General: He is not in acute distress.    Appearance: He is well-developed. He is not diaphoretic.     Comments: Well-appearing, comfortable, cooperative  HENT:     Head: Normocephalic and atraumatic.  Eyes:     General:        Right eye: No discharge.        Left eye: No discharge.     Conjunctiva/sclera: Conjunctivae normal.  Neck:     Thyroid: No thyromegaly.  Cardiovascular:      Rate and Rhythm: Normal rate.     Heart sounds: Normal heart sounds. No murmur.     Comments: Irregularly irregular Pulmonary:     Effort: Pulmonary effort is normal. No respiratory distress.  Breath sounds: Normal breath sounds. No wheezing or rales.  Musculoskeletal:        General: Normal range of motion.     Cervical back: Normal range of motion and neck supple.  Lymphadenopathy:     Cervical: No cervical adenopathy.  Skin:    General: Skin is warm and dry.     Findings: No erythema or rash.  Neurological:     Mental Status: He is alert and oriented to person, place, and time.  Psychiatric:        Behavior: Behavior normal.     Comments: Well groomed, good eye contact, normal speech and thoughts    Results for orders placed or performed in visit on 10/23/18  Urine Culture  Result Value Ref Range   Urine Culture, Routine Final report (A)    Organism ID, Bacteria Escherichia coli (A)    Antimicrobial Susceptibility Comment       Assessment & Plan:   Problem List Items Addressed This Visit    Spinal stenosis of lumbar region without neurogenic claudication   DDD (degenerative disc disease), lumbar   Chronic atrial fibrillation   Relevant Medications   metoprolol succinate (TOPROL-XL) 100 MG 24 hr tablet   Benign hypertension with CKD (chronic kidney disease) stage III - Primary   Relevant Medications   metoprolol succinate (TOPROL-XL) 100 MG 24 hr tablet    Other Visit Diagnoses    Encounter to establish care with new doctor          #HTN Controlled overall Refill Metoprolol 90 day  #AFib, chronic Without elevated HR or flare Currently stable on rate control Not on anticoagulation Followed by Dr Ubaldo Glassing [facility] Card[facility], as scheduled next visit in 02/2020 approx, can do EKG at that time to update  #Chronic back pain, Lumbar DDD Spinal stenosis Followed by [facility]surgery[facility]pain - Dr Sharlet Salina and Loree Fee Meeler NP Improved on ESI Injection treatment  #Travel  international 1 of 2 covid19 vaccine, discussed he is not interested in 2nd dose, considered "Unvaccatinated" but still has protection with 1 of 2 doses. He can proceed with routine protocol testing COVID PCR test 3 days prior to travel, gave [place] info schedule online [place] No antibody test needed.  Meds ordered this encounter  Medications  . metoprolol succinate (TOPROL-XL) 100 MG 24 hr tablet    Sig: Take 1 tablet (100 mg total) by mouth daily. Take with or immediately following a meal.    Dispense:  90 tablet    Refill:  3    Please discontinue any previous refills from prior PCP Dr Army Melia. May fill this new rx when ready.     Follow up plan: Return in about 6 months (around 04/13/2020) for Annual Physical.  Future labs ordered for 04/2020  Nobie Putnam, [facility] 10/13/2019, 10:59 AM

## 2019-10-13 NOTE — Patient Instructions (Addendum)
Thank you for coming to the office today.  3 days prior to travel to [place]  [facility] COVID19 Testing Information  LOCATION:  [facility] [place] [address]  Hours: Monday - Friday 2:00pm to 5:00pm  COVID-19 Testing By Appointment Only  Online scheduling can be done online at [email or web address] or by texting "COVID" to [phone].  You will be notified by MyChart or by Phone.   DUE for FASTING BLOOD WORK (no food or drink after midnight before the lab appointment, only water or coffee without cream/sugar on the morning of)  SCHEDULE "Lab Only" visit in the morning at the clinic for lab draw in 6 MONTHS   - Make sure Lab Only appointment is at about 1 week before your next appointment, so that results will be available  For Lab Results, once available within 2-3 days of blood draw, you can can log in to MyChart online to view your results and a brief explanation. Also, we can discuss results at next follow-up visit.   Please schedule a Follow-up Appointment to: Return in about 6 months (around 04/13/2020) for Annual Physical.  If you have any other questions or concerns, please feel free to call the office or send a message through [place]. You may also schedule an earlier appointment if necessary.  Additionally, you may be receiving a survey about your experience at our office within a few days to 1 week by e-mail or mail. We value your feedback.  Nobie Putnam, DO [facility]

## 2019-10-20 ENCOUNTER — Ambulatory Visit (INDEPENDENT_AMBULATORY_CARE_PROVIDER_SITE_OTHER): Payer: Medicare Other

## 2019-10-20 ENCOUNTER — Other Ambulatory Visit: Payer: Self-pay

## 2019-10-20 VITALS — BP 133/78 | HR 61 | Temp 97.5°F | Ht 74.0 in | Wt 254.0 lb

## 2019-10-20 DIAGNOSIS — Z0000 Encounter for general adult medical examination without abnormal findings: Secondary | ICD-10-CM

## 2019-10-20 NOTE — Patient Instructions (Signed)
Daniel Brandt , Thank you for taking time to come for your Medicare Wellness Visit. I appreciate your ongoing commitment to your health goals. Please review the following plan we discussed and let me know if I can assist you in the future.   Screening recommendations/referrals: Colonoscopy: no longer required  Recommended yearly ophthalmology/optometry visit for glaucoma screening and checkup Recommended yearly dental visit for hygiene and checkup  Vaccinations: Influenza vaccine: up to date  Pneumococcal vaccine: up to date  Tdap vaccine: up to date  Shingles vaccine: shingrix completed    Covid-19: completed   Advanced directives: Please bring a copy of your health care power of attorney and living will to the office at your convenience.  Conditions/risks identified: none   Next appointment: Follow up in one year for your annual wellness visit   Preventive Care 65 Years and Older, Male Preventive care refers to lifestyle choices and visits with your health care provider that can promote health and wellness. What does preventive care include?  A yearly physical exam. This is also called an annual well check.  Dental exams once or twice a year.  Routine eye exams. Ask your health care provider how often you should have your eyes checked.  Personal lifestyle choices, including:  Daily care of your teeth and gums.  Regular physical activity.  Eating a healthy diet.  Avoiding tobacco and drug use.  Limiting alcohol use.  Practicing safe sex.  Taking low doses of aspirin every day.  Taking vitamin and mineral supplements as recommended by your health care provider. What happens during an annual well check? The services and screenings done by your health care provider during your annual well check will depend on your age, overall health, lifestyle risk factors, and family history of disease. Counseling  Your health care provider may ask you questions about  your:  Alcohol use.  Tobacco use.  Drug use.  Emotional well-being.  Home and relationship well-being.  Sexual activity.  Eating habits.  History of falls.  Memory and ability to understand (cognition).  Work and work Statistician. Screening  You may have the following tests or measurements:  Height, weight, and BMI.  Blood pressure.  Lipid and cholesterol levels. These may be checked every 5 years, or more frequently if you are over 43 years old.  Skin check.  Lung cancer screening. You may have this screening every year starting at age 57 if you have a 30-pack-year history of smoking and currently smoke or have quit within the past 15 years.  Fecal occult blood test (FOBT) of the stool. You may have this test every year starting at age 39.  Flexible sigmoidoscopy or colonoscopy. You may have a sigmoidoscopy every 5 years or a colonoscopy every 10 years starting at age 51.  Prostate cancer screening. Recommendations will vary depending on your family history and other risks.  Hepatitis C blood test.  Hepatitis B blood test.  Sexually transmitted disease (STD) testing.  Diabetes screening. This is done by checking your blood sugar (glucose) after you have not eaten for a while (fasting). You may have this done every 1-3 years.  Abdominal aortic aneurysm (AAA) screening. You may need this if you are a current or former smoker.  Osteoporosis. You may be screened starting at age 56 if you are at high risk. Talk with your health care provider about your test results, treatment options, and if necessary, the need for more tests. Vaccines  Your health care provider may recommend certain  vaccines, such as:  Influenza vaccine. This is recommended every year.  Tetanus, diphtheria, and acellular pertussis (Tdap, Td) vaccine. You may need a Td booster every 10 years.  Zoster vaccine. You may need this after age 26.  Pneumococcal 13-valent conjugate (PCV13) vaccine.  One dose is recommended after age 46.  Pneumococcal polysaccharide (PPSV23) vaccine. One dose is recommended after age 29. Talk to your health care provider about which screenings and vaccines you need and how often you need them. This information is not intended to replace advice given to you by your health care provider. Make sure you discuss any questions you have with your health care provider. Document Released: 07/22/2015 Document Revised: 03/14/2016 Document Reviewed: 04/26/2015 Elsevier Interactive Patient Education  2017 [place] Prevention in the Home Falls can cause injuries. They can happen to people of all ages. There are many things you can do to make your home safe and to help prevent falls. What can I do on the outside of my home?  Regularly fix the edges of walkways and driveways and fix any cracks.  Remove anything that might make you trip as you walk through a door, such as a raised step or threshold.  Trim any bushes or trees on the path to your home.  Use bright outdoor lighting.  Clear any walking paths of anything that might make someone trip, such as rocks or tools.  Regularly check to see if handrails are loose or broken. Make sure that both sides of any steps have handrails.  Any raised decks and porches should have guardrails on the edges.  Have any leaves, snow, or ice cleared regularly.  Use sand or salt on walking paths during winter.  Clean up any spills in your garage right away. This includes oil or grease spills. What can I do in the bathroom?  Use night lights.  Install grab bars by the toilet and in the tub and shower. Do not use towel bars as grab bars.  Use non-skid mats or decals in the tub or shower.  If you need to sit down in the shower, use a plastic, non-slip stool.  Keep the floor dry. Clean up any water that spills on the floor as soon as it happens.  Remove soap buildup in the tub or shower regularly.  Attach bath  mats securely with double-sided non-slip rug tape.  Do not have throw rugs and other things on the floor that can make you trip. What can I do in the bedroom?  Use night lights.  Make sure that you have a light by your bed that is easy to reach.  Do not use any sheets or blankets that are too big for your bed. They should not hang down onto the floor.  Have a firm chair that has side arms. You can use this for support while you get dressed.  Do not have throw rugs and other things on the floor that can make you trip. What can I do in the kitchen?  Clean up any spills right away.  Avoid walking on wet floors.  Keep items that you use a lot in easy-to-reach places.  If you need to reach something above you, use a strong step stool that has a grab bar.  Keep electrical cords out of the way.  Do not use floor polish or wax that makes floors slippery. If you must use wax, use non-skid floor wax.  Do not have throw rugs and other things  on the floor that can make you trip. What can I do with my stairs?  Do not leave any items on the stairs.  Make sure that there are handrails on both sides of the stairs and use them. Fix handrails that are broken or loose. Make sure that handrails are as long as the stairways.  Check any carpeting to make sure that it is firmly attached to the stairs. Fix any carpet that is loose or worn.  Avoid having throw rugs at the top or bottom of the stairs. If you do have throw rugs, attach them to the floor with carpet tape.  Make sure that you have a light switch at the top of the stairs and the bottom of the stairs. If you do not have them, ask someone to add them for you. What else can I do to help prevent falls?  Wear shoes that:  Do not have high heels.  Have rubber bottoms.  Are comfortable and fit you well.  Are closed at the toe. Do not wear sandals.  If you use a stepladder:  Make sure that it is fully opened. Do not climb a closed  stepladder.  Make sure that both sides of the stepladder are locked into place.  Ask someone to hold it for you, if possible.  Clearly mark and make sure that you can see:  Any grab bars or handrails.  First and last steps.  Where the edge of each step is.  Use tools that help you move around (mobility aids) if they are needed. These include:  Canes.  Walkers.  Scooters.  Crutches.  Turn on the lights when you go into a dark area. Replace any light bulbs as soon as they burn out.  Set up your furniture so you have a clear path. Avoid moving your furniture around.  If any of your floors are uneven, fix them.  If there are any pets around you, be aware of where they are.  Review your medicines with your doctor. Some medicines can make you feel dizzy. This can increase your chance of falling. Ask your doctor what other things that you can do to help prevent falls. This information is not intended to replace advice given to you by your health care provider. Make sure you discuss any questions you have with your health care provider. Document Released: 04/21/2009 Document Revised: 12/01/2015 Document Reviewed: 07/30/2014 Elsevier Interactive Patient Education  2017 [employer].

## 2019-10-20 NOTE — Progress Notes (Signed)
Subjective:   Daniel Brandt is a 86 y.o. male who presents for Medicare Annual/Subsequent preventive examination.  Review of Systems:   Cardiac Risk Factors include: advanced age (>56men, >30 women);dyslipidemia;hypertension;male gender     Objective:    Vitals: BP 133/78 (BP Location: Left Arm, Patient Position: Sitting, Cuff Size: Normal)   Pulse 61   Temp (!) 97.5 F (36.4 C) (Temporal)   Ht 6\' 2"  (1.88 m)   Wt 254 lb (115.2 kg)   BMI 32.61 kg/m   Body mass index is 32.61 kg/m.  Advanced Directives 10/20/2019 04/02/2018 04/01/2017 10/04/2016  Does Patient Have a Medical Advance Directive? Yes Yes Yes Yes  Type of Advance Directive Living will;Healthcare Power of [place];Living will [place];Living will [place];Living will  Copy of [place] in Chart? No - copy requested No - copy requested No - copy requested -    Tobacco Social History   Tobacco Use  Smoking Status Former Smoker  . Packs/day: 1.00  . Years: 0.50  . Pack years: 0.50  . Types: Cigarettes  . Quit date: 10/04/1956  . Years since quitting: 63.0  Smokeless Tobacco Never Used  Tobacco Comment   smoking cessation materials not required     Counseling given: Not Answered Comment: smoking cessation materials not required   Clinical Intake:  Pre-visit preparation completed: Yes  Pain : No/denies pain     Nutritional Status: BMI > 30  Obese Nutritional Risks: None Diabetes: No  How often do you need to have someone help you when you read instructions, pamphlets, or other written materials from your doctor or pharmacy?: 1 - Never  Interpreter Needed?: No  Information entered by :: Daniel Goodie,LPN  Past Medical History:  Diagnosis Date  . A-fib ([place])   . Arthritis   . FH: heart failure 10/04/2016  . Former smoker   . Heart murmur   . Hypertension   . Malaria   . Stroke [facility])    Past Surgical  History:  Procedure Laterality Date  . COLONOSCOPY  06/12/2016  . FRACTURE SURGERY    . KNEE REPLACE Bilateral 2017  . LUMBAR FUSION  1995   L3 L4   Family History  Problem Relation Age of Onset  . Heart disease Mother   . Heart disease Father   . Gout Sister   . Heart attack Son    Social History   Socioeconomic History  . Marital status: Married    Spouse name: Not on file  . Number of children: 6  . Years of education: Not on file  . Highest education level: Associate degree: academic program  Occupational History  . Occupation: Retired  Tobacco Use  . Smoking status: Former Smoker    Packs/day: 1.00    Years: 0.50    Pack years: 0.50    Types: Cigarettes    Quit date: 10/04/1956    Years since quitting: 63.0  . Smokeless tobacco: Never Used  . Tobacco comment: smoking cessation materials not required  Substance and Sexual Activity  . Alcohol use: Yes    Alcohol/week: 28.0 standard drinks    Types: 28 Shots of liquor per week    Comment: Scotch  . Drug use: No  . Sexual activity: Not Currently  Other Topics Concern  . Not on file  Social History Narrative  . Not on file   Social Determinants of Health   Financial Resource Strain: Low Risk   .  Difficulty of Paying Living Expenses: Not hard at all  Food Insecurity:   . Worried About Charity fundraiser in the Last Year:   . Arboriculturist in the Last Year:   Transportation Needs: No Transportation Needs  . Lack of Transportation (Medical): No  . Lack of Transportation (Non-Medical): No  Physical Activity:   . Days of Exercise per Week:   . Minutes of Exercise per Session:   Stress:   . Feeling of Stress :   Social Connections: Slightly Isolated  . Frequency of Communication with Friends and Family: More than three times a week  . Frequency of Social Gatherings with Friends and Family: More than three times a week  . Attends Religious Services: Never  . Active Member of Clubs or Organizations: Yes  .  Attends Archivist Meetings: More than 4 times per year  . Marital Status: Married    Outpatient Encounter Medications as of 10/20/2019  Medication Sig  . aspirin 81 MG tablet Take 1 tablet (81 mg total) by mouth daily.  . metoprolol succinate (TOPROL-XL) 100 MG 24 hr tablet Take 1 tablet (100 mg total) by mouth daily. Take with or immediately following a meal.  . Multiple Vitamins-Minerals (MULTIVITAMIN WITH MINERALS) tablet Take 1 tablet by mouth daily.  . NON FORMULARY CPAP nightly  . olmesartan (BENICAR) 40 MG tablet TAKE 1 TABLET BY MOUTH ONCE DAILY  . Omega-3 Fatty Acids (FISH OIL) 1000 MG CAPS Take 2 capsules by mouth 2 (two) times daily.  . vitamin B-12 (CYANOCOBALAMIN) 500 MCG tablet Take 500 mcg by mouth daily.   No facility-administered encounter medications on file as of 10/20/2019.    Activities of Daily Living In your present state of health, do you have any difficulty performing the following activities: 10/20/2019 10/13/2019  Hearing? Y Y  Comment no hearing aids -  Vision? N N  Comment reading glasses, no eye dr currently. -  Difficulty concentrating or making decisions? Y N  Walking or climbing stairs? N Y  Dressing or bathing? N N  Doing errands, shopping? N N  Preparing Food and eating ? N -  Using the Toilet? N -  In the past six months, have you accidently leaked urine? N -  Do you have problems with loss of bowel control? N -  Managing your Medications? N -  Managing your Finances? N -  Housekeeping or managing your Housekeeping? N -  Some recent data might be hidden    Patient Care Team: Daniel Hauser, DO as PCP - General (Family Medicine) Daniel Artis, MD as Referring Physician (Gastroenterology) Daniel Grit, MD as Referring Physician (Dermatology)   Assessment:   This is a routine wellness examination for Daniel Brandt.  Exercise Activities and Dietary recommendations Current Exercise Habits: The patient does not participate in regular  exercise at present, Exercise limited by: None identified  Goals Addressed   None     Fall Risk: Fall Risk  10/13/2019 10/23/2018 09/04/2018 04/02/2018 04/01/2017  Falls in the past year? 0 0 0 No No  Number falls in past yr: 0 0 0 - -  Injury with Fall? 0 0 0 - -  Risk for fall due to : - - - Impaired vision;History of fall(s) -  Risk for fall due to: Comment - - - wears eyeglasses -  Follow up Falls evaluation completed - Falls evaluation completed - -    FALL RISK PREVENTION PERTAINING TO THE HOME:  Any stairs in or around the home? No  If so, are there any without handrails? No   Home free of loose throw rugs in walkways, pet beds, electrical cords, etc? Yes  Adequate lighting in your home to reduce risk of falls? Yes   ASSISTIVE DEVICES UTILIZED TO PREVENT FALLS:  Life alert? No  Use of a cane, walker or w/c? No  Grab bars in the bathroom? No  Shower chair or bench in shower? No  Elevated toilet seat or a handicapped toilet? No   TIMED UP AND GO:  Was the test performed? Yes .  Length of time to ambulate 10 feet: 8 sec.   GAIT:  Appearance of gait: Gait steady and fast without the use of an assistive device.  Education: Fall risk prevention has been discussed.  Intervention(s) required? No  DME/home health order needed?  No   Depression Screen PHQ 2/9 Scores 10/20/2019 10/13/2019 10/23/2018 09/04/2018  PHQ - 2 Score 0 0 0 0  PHQ- 9 Score - - 0 -    Cognitive Function     6CIT Screen 10/20/2019 04/02/2018 04/01/2017  What Year? 0 points 0 points 0 points  What month? 0 points 0 points 0 points  What time? 0 points 0 points 0 points  Count back from 20 0 points 0 points 0 points  Months in reverse 0 points 0 points 0 points  Repeat phrase 0 points 0 points 0 points  Total Score 0 0 0    Immunization History  Administered Date(s) Administered  . Influenza, High Dose Seasonal PF 04/01/2017, 04/02/2018  . Influenza,inj,Quad PF,6+ Mos 03/09/2016  . PFIZER  SARS-COV-2 Vaccination 09/06/2019  . Pneumococcal Conjugate-13 03/09/2016  . Pneumococcal Polysaccharide-23 10/09/2017  . Tdap 10/04/2016  . Zoster Recombinat (Shingrix) 08/20/2016, 12/21/2016    Qualifies for Shingles Vaccine? Shingrix completed   Tdap: up to date   Flu Vaccine: up to date   Pneumococcal Vaccine: up to date   Covid-19 Vaccine: Completed vaccines  Screening Tests Health Maintenance  Topic Date Due  . INFLUENZA VACCINE  02/07/2020  . TETANUS/TDAP  10/05/2026  . PNA vac Low Risk Adult  Completed   Cancer Screenings:  Colorectal Screening: no longer required   Lung Cancer Screening: (Low Dose CT Chest recommended if Age 51-80 years, 30 pack-year currently smoking OR have quit w/in 15years.) does not qualify.     Additional Screening:  Hepatitis C Screening: does not qualify  Vision Screening: Recommended annual ophthalmology exams for early detection of glaucoma and other disorders of the eye. Is the patient up to date with their annual eye exam?  No    Dental Screening: Recommended annual dental exams for proper oral hygiene  Community Resource Referral:  CRR required this visit?  No        Plan:  I have personally reviewed and addressed the Medicare Annual Wellness questionnaire and have noted the following in the patient's chart:  A. Medical and social history B. Use of alcohol, tobacco or illicit drugs  C. Current medications and supplements D. Functional ability and status E.  Nutritional status F.  Physical activity G. Advance directives H. List of other physicians I.  Hospitalizations, surgeries, and ER visits in previous 12 months J.  [place] such as hearing and vision if needed, cognitive and depression L. Referrals and appointments   In addition, I have reviewed and discussed with patient certain preventive protocols, quality metrics, and best practice recommendations. A written personalized care plan  for preventive  services as well as general preventive health recommendations were provided to patient.   Signed,   Bevelyn Ngo, LPN  [id number] Nurse Health Advisor   Nurse Notes: none

## 2019-10-27 ENCOUNTER — Encounter: Payer: Medicare Other | Admitting: Internal Medicine

## 2019-12-10 DIAGNOSIS — M5416 Radiculopathy, lumbar region: Secondary | ICD-10-CM | POA: Diagnosis not present

## 2019-12-10 DIAGNOSIS — M48062 Spinal stenosis, lumbar region with neurogenic claudication: Secondary | ICD-10-CM | POA: Diagnosis not present

## 2019-12-10 DIAGNOSIS — M5136 Other intervertebral disc degeneration, lumbar region: Secondary | ICD-10-CM | POA: Diagnosis not present

## 2020-03-01 ENCOUNTER — Other Ambulatory Visit: Payer: Self-pay

## 2020-03-01 ENCOUNTER — Ambulatory Visit (INDEPENDENT_AMBULATORY_CARE_PROVIDER_SITE_OTHER): Payer: Medicare Other | Admitting: Family Medicine

## 2020-03-01 ENCOUNTER — Other Ambulatory Visit: Payer: Self-pay | Admitting: Family Medicine

## 2020-03-01 ENCOUNTER — Encounter: Payer: Self-pay | Admitting: Family Medicine

## 2020-03-01 VITALS — BP 125/75 | HR 74 | Temp 97.5°F | Resp 16 | Ht 74.0 in | Wt 258.6 lb

## 2020-03-01 DIAGNOSIS — M3390 Dermatopolymyositis, unspecified, organ involvement unspecified: Secondary | ICD-10-CM | POA: Diagnosis not present

## 2020-03-01 DIAGNOSIS — M3313 Other dermatomyositis without myopathy: Secondary | ICD-10-CM

## 2020-03-01 DIAGNOSIS — L944 Gottron's papules: Secondary | ICD-10-CM | POA: Diagnosis not present

## 2020-03-01 DIAGNOSIS — I10 Essential (primary) hypertension: Secondary | ICD-10-CM

## 2020-03-01 MED ORDER — OLMESARTAN MEDOXOMIL 40 MG PO TABS: 40.0000 mg | ORAL_TABLET | Freq: Every day | ORAL | 3 refills | Status: DC

## 2020-03-01 NOTE — Progress Notes (Signed)
Subjective:    Patient ID: Daniel Brandt, male    DOB: [birth year], 86 y.o.   MRN: [id number]  Daniel Brandt is a 86 y.o. male presenting on 03/01/2020 for Rash (Left arm onset 6 month--not gettingworst but irritating)   HPI   Suspected Dermatoyosis Rash Left hand joint, dorsal Gottron's Papules Reports episodic chronic recurrent hand rash on L hand mostly dorsal over joints - Admits some itching, break in skins, has used liquid skin bandaid - Deformity in Left hand with arthritis - Reports gradual weakness in generalized - Previous dx with osteoarthritis by previous doctor without confirmation of rheumatoid or other inflammatory arthropathy in past. He has seen rheumatologist before unable to recall specific diagnosis. - S/p bilateral knee joint replacements in past. - Tried Clobetasol topical, Clotrimazole-Betamethasone, Neosporin - temporary relief but not resolving the problem   Depression screen [facility] 2/9 10/20/2019 10/13/2019 10/23/2018  Decreased Interest 0 0 0  Down, Depressed, Hopeless 0 0 0  PHQ - 2 Score 0 0 0  Altered sleeping - - 0  Tired, decreased energy - - 0  Change in appetite - - 0  Feeling bad or failure about yourself  - - 0  Trouble concentrating - - 0  Moving slowly or fidgety/restless - - 0  Suicidal thoughts - - 0  PHQ-9 Score - - 0  Difficult doing work/chores - - -    Social History   Tobacco Use  . Smoking status: Former Smoker    Packs/day: 1.00    Years: 0.50    Pack years: 0.50    Types: Cigarettes    Quit date: 10/04/1956    Years since quitting: 63.4  . Smokeless tobacco: Never Used  . Tobacco comment: smoking cessation materials not required  Vaping Use  . Vaping Use: Never used  Substance Use Topics  . Alcohol use: Yes    Alcohol/week: 28.0 standard drinks    Types: 28 Shots of liquor per week    Comment: Scotch  . Drug use: No    Review of Systems Per HPI unless specifically indicated above     Objective:    BP 125/75    Pulse 74   Temp (!) 97.5 F (36.4 C) (Temporal)   Resp 16   Ht 6\' 2"  (1.88 m)   Wt 258 lb 9.6 oz (117.3 kg)   SpO2 99%   BMI 33.20 kg/m   Wt Readings from Last 3 Encounters:  03/01/20 258 lb 9.6 oz (117.3 kg)  10/20/19 254 lb (115.2 kg)  10/13/19 255 lb 6.4 oz (115.8 kg)    Physical Exam Vitals and nursing note reviewed.  Constitutional:      General: He is not in acute distress.    Appearance: He is well-developed. He is not diaphoretic.     Comments: Well-appearing, comfortable, cooperative  HENT:     Head: Normocephalic and atraumatic.  Eyes:     General:        Right eye: No discharge.        Left eye: No discharge.     Conjunctiva/sclera: Conjunctivae normal.  Cardiovascular:     Rate and Rhythm: Normal rate.  Pulmonary:     Effort: Pulmonary effort is normal.  Skin:    General: Skin is warm and dry.     Findings: Rash (Left hand dorsal over multiple joints thickened erythematous plaque like raised rash with some ulcerated superficial skin and callus formation) present. No erythema.  Neurological:     Mental  Status: He is alert and oriented to person, place, and time.  Psychiatric:        Behavior: Behavior normal.     Comments: Well groomed, good eye contact, normal speech and thoughts      Results for orders placed or performed in visit on 10/23/18  Urine Culture  Result Value Ref Range   Urine Culture, Routine Final report (A)    Organism ID, Bacteria Escherichia coli (A)    Antimicrobial Susceptibility Comment       Assessment & Plan:   Problem List Items Addressed This Visit    None    Visit Diagnoses    Dermatomyositis ([place])    -  Primary   Relevant Orders   Ambulatory referral to Rheumatology   Gottron's papules       Relevant Orders   Ambulatory referral to Rheumatology      Clinically with multiple joints arthritis, bilaterally, as well as Left hand finger deformity with misalignment He has prior history of rugby injuries in past Known  traumatic osteoarthritis Now concern with recurrent rash with plaque formation on dorsal hand He admits some generalized weakness but he relates this to his joints, has intact muscle strength on exam.  Clinically suspicious for possible dermatomyositis or gottron's papular rash  Will consult Rheumatology for further evaluation based on his clinical constellation of symptoms without pursuing lab work up today. May benefit from more focal diagnostic work up and imaging in near future.  May use topical Clobetasol PRN as topical steroid, discussed avoiding sun exposure, can use fabric/cloth glove to help topical absorb and protect skin.  No orders of the defined types were placed in this encounter.   Orders Placed This Encounter  Procedures  . Ambulatory referral to Rheumatology    Referral Priority:   Routine    Referral Type:   Consultation    Referral Reason:   Specialty Services Required    Requested Specialty:   Rheumatology    Number of Visits Requested:   1     Follow up plan: Return if symptoms worsen or fail to improve, for follow-up after Rheumatology.   Nobie Putnam, [facility] 03/01/2020, 11:29 AM

## 2020-03-01 NOTE — Patient Instructions (Addendum)
Thank you for coming to the office today.  [facility] [address], [place] [address] Hours: M-Th 8-5pm / F 8-12noon Phone: [phone]  Stay tuned for referral.   Keep using topical Clobetasol - use cloth or fabric glove at night when use cream. - ALSO keep in mind trying to wear glove to avoid sun exposure to avoid flaring it up.  Dyshidrotic Eczema is the other condition  Please schedule a Follow-up Appointment to: Return if symptoms worsen or fail to improve, for follow-up after Rheumatology.  If you have any other questions or concerns, please feel free to call the office or send a message through [place]. You may also schedule an earlier appointment if necessary.  Additionally, you may be receiving a survey about your experience at our office within a few days to 1 week by e-mail or mail. We value your feedback.  Nobie Putnam, DO [facility], [facility]  Dermatomyositis Dermatomyositis is a rare muscle disease that causes muscle weakness and a rash. It usually affects muscles that are closest to the trunk of your body (torso). You may have a combination of muscle weakness, pain, and swelling. This condition can make it hard to rise from a sitting position, climb stairs, lift objects, or reach over your head. What are the causes? The cause of this condition is not known. What increases the risk? You are more likely to develop this condition if:  You are male.  You have cancer.  You have lung disease. What are the signs or symptoms? Symptoms of this condition may include:  A bluish-purple rash that may be itchy. This often develops before the muscle weakness. It appears on the face, neck, shoulders, upper chest, elbows, knees, knuckles, and back.  Swelling of the face and eyelids.  Hardened bumps of calcium deposits under your skin (especially in children).  Trouble swallowing, talking, and  breathing.  Muscle aches and tenderness.  Fatigue.  Weight loss.  Low fever.  Open wounds (ulcers).  Heartburn from changes in muscles of the esophagus.  Muscle weakness, swelling, and pain. How is this diagnosed? This condition may be diagnosed based on:  Blood tests.  A test to check whether your muscles are responding properly to electrical signals from your nerves (electromyogram).  Removal of a small tissue sample from a weakened muscle to be examined under a microscope (muscle biopsy).  Removal of a small skin sample from an area with a rash to be examined under a microscope (skin biopsy).  Chest X-ray.  MRI. How is this treated? This condition may be treated with:  Medicines that weaken and decrease the activity of your body's disease-fighting system (immune system) and reduce inflammation (corticosteroids).  Medicine containing antibodies to help your body fight infections (immunoglobulin).  Medicines to reduce inflammation in your body.  Physical therapy and exercise to prevent muscle loss (atrophy). Follow these instructions at home:  Wear protective clothing and high-protection sunscreen when you go outside. Areas that are affected by a rash are more sensitive to the sun.  Use skin creams or ointments as told by your health care provider.  Take over-the-counter and prescription medicines only as told by your health care provider.  If physical therapy was prescribed, do exercises as directed.  Do not eat right before bedtime. Raise (elevate) the head of your bed to decrease heartburn.  If directed by your health care provider, eat more protein in your diet. Contact a health care provider  if:  Your condition gets worse.  You have unexplained muscle weakness.  You develop a new rash.  You have a fever and cough. Get help right away if:  You have difficulty swallowing.  You develop breathing problems. Summary  Dermatomyositis is a rare muscle  disease that causes muscle weakness and a rash.  You may have a combination of muscle weakness, pain, and swelling.  Treatment may include medicines and physical therapy.  Wear protective clothing and high-protection sunscreen when you go outside. Areas that are affected by a rash are more sensitive to the sun. This information is not intended to replace advice given to you by your health care provider. Make sure you discuss any questions you have with your health care provider. Document Revised: 06/07/2017 Document Reviewed: 07/17/2016 Elsevier Patient Education  2020 [employer].

## 2020-03-15 ENCOUNTER — Telehealth: Payer: Self-pay | Admitting: Family Medicine

## 2020-03-15 NOTE — Telephone Encounter (Signed)
Spoke to the pharmacy they found it will make it ready in 90 minutes also inform the patient.

## 2020-03-15 NOTE — Telephone Encounter (Signed)
Wife  Stopped by said that pt did not have medication called in Benicar  40MG 

## 2020-04-04 DIAGNOSIS — Z79899 Other long term (current) drug therapy: Secondary | ICD-10-CM | POA: Diagnosis not present

## 2020-04-04 DIAGNOSIS — M6281 Muscle weakness (generalized): Secondary | ICD-10-CM | POA: Diagnosis not present

## 2020-04-04 DIAGNOSIS — L409 Psoriasis, unspecified: Secondary | ICD-10-CM | POA: Diagnosis not present

## 2020-04-04 DIAGNOSIS — L4050 Arthropathic psoriasis, unspecified: Secondary | ICD-10-CM | POA: Diagnosis not present

## 2020-04-06 DIAGNOSIS — L4050 Arthropathic psoriasis, unspecified: Secondary | ICD-10-CM | POA: Diagnosis not present

## 2020-04-06 DIAGNOSIS — I8311 Varicose veins of right lower extremity with inflammation: Secondary | ICD-10-CM | POA: Diagnosis not present

## 2020-04-07 DIAGNOSIS — I517 Cardiomegaly: Secondary | ICD-10-CM | POA: Diagnosis not present

## 2020-04-07 DIAGNOSIS — J9811 Atelectasis: Secondary | ICD-10-CM | POA: Diagnosis not present

## 2020-04-11 ENCOUNTER — Other Ambulatory Visit: Payer: Self-pay | Admitting: *Deleted

## 2020-04-11 DIAGNOSIS — R351 Nocturia: Secondary | ICD-10-CM

## 2020-04-11 DIAGNOSIS — I482 Chronic atrial fibrillation, unspecified: Secondary | ICD-10-CM

## 2020-04-11 DIAGNOSIS — E781 Pure hyperglyceridemia: Secondary | ICD-10-CM

## 2020-04-11 DIAGNOSIS — I739 Peripheral vascular disease, unspecified: Secondary | ICD-10-CM

## 2020-04-11 DIAGNOSIS — R7309 Other abnormal glucose: Secondary | ICD-10-CM

## 2020-04-11 DIAGNOSIS — Z0000 Encounter for general adult medical examination without abnormal findings: Secondary | ICD-10-CM

## 2020-04-11 DIAGNOSIS — I10 Essential (primary) hypertension: Secondary | ICD-10-CM

## 2020-04-12 ENCOUNTER — Other Ambulatory Visit: Payer: Self-pay

## 2020-04-12 ENCOUNTER — Other Ambulatory Visit: Payer: Medicare Other

## 2020-04-12 DIAGNOSIS — E781 Pure hyperglyceridemia: Secondary | ICD-10-CM | POA: Diagnosis not present

## 2020-04-12 DIAGNOSIS — R7309 Other abnormal glucose: Secondary | ICD-10-CM | POA: Diagnosis not present

## 2020-04-12 DIAGNOSIS — I482 Chronic atrial fibrillation, unspecified: Secondary | ICD-10-CM | POA: Diagnosis not present

## 2020-04-12 DIAGNOSIS — I10 Essential (primary) hypertension: Secondary | ICD-10-CM | POA: Diagnosis not present

## 2020-04-13 LAB — CBC WITH DIFFERENTIAL/PLATELET
Absolute Monocytes: 409 cells/uL (ref 200–950)
Basophils Absolute: 9 cells/uL (ref 0–200)
Basophils Relative: 0.2 %
Eosinophils Absolute: 211 cells/uL (ref 15–500)
Eosinophils Relative: 4.8 %
HCT: 40.7 % (ref 38.5–50.0)
Hemoglobin: 14 g/dL (ref 13.2–17.1)
Lymphs Abs: 1333 cells/uL (ref 850–3900)
MCH: 36.3 pg — ABNORMAL HIGH (ref 27.0–33.0)
MCHC: 34.4 g/dL (ref 32.0–36.0)
MCV: 105.4 fL — ABNORMAL HIGH (ref 80.0–100.0)
MPV: 8.5 fL (ref 7.5–12.5)
Monocytes Relative: 9.3 %
Neutro Abs: 2438 cells/uL (ref 1500–7800)
Neutrophils Relative %: 55.4 %
Platelets: 173 10*3/uL (ref 140–400)
RBC: 3.86 10*6/uL — ABNORMAL LOW (ref 4.20–5.80)
RDW: 12.9 % (ref 11.0–15.0)
Total Lymphocyte: 30.3 %
WBC: 4.4 10*3/uL (ref 3.8–10.8)

## 2020-04-13 LAB — LIPID PANEL
Cholesterol: 152 mg/dL (ref <200)
HDL: 41 mg/dL (ref >=40)
LDL Cholesterol (Calc): 78 mg/dL (calc)
Non-HDL Cholesterol (Calc): 111 mg/dL (calc) (ref <130)
Total CHOL/HDL Ratio: 3.7 (calc) (ref <5.0)
Triglycerides: 236 mg/dL — ABNORMAL HIGH (ref <150)

## 2020-04-13 LAB — COMPLETE METABOLIC PANEL WITH GFR
AG Ratio: 1.9 (calc) (ref 1.0–2.5)
ALT: 22 U/L (ref 9–46)
AST: 23 U/L (ref 10–35)
Albumin: 4.1 g/dL (ref 3.6–5.1)
Alkaline phosphatase (APISO): 50 U/L (ref 35–144)
BUN/Creatinine Ratio: 12 (calc) (ref 6–22)
BUN: 14 mg/dL (ref 7–25)
CO2: 23 mmol/L (ref 20–32)
Calcium: 9 mg/dL (ref 8.6–10.3)
Chloride: 104 mmol/L (ref 98–110)
Creat: 1.2 mg/dL — ABNORMAL HIGH (ref 0.70–1.11)
GFR, Est African American: 64 mL/min/{1.73_m2} (ref >=60)
GFR, Est Non African American: 56 mL/min/{1.73_m2} — ABNORMAL LOW (ref >=60)
Globulin: 2.2 g/dL (calc) (ref 1.9–3.7)
Glucose, Bld: 93 mg/dL (ref 65–99)
Potassium: 4.2 mmol/L (ref 3.5–5.3)
Sodium: 138 mmol/L (ref 135–146)
Total Bilirubin: 0.7 mg/dL (ref 0.2–1.2)
Total Protein: 6.3 g/dL (ref 6.1–8.1)

## 2020-04-13 LAB — HEMOGLOBIN A1C
Hgb A1c MFr Bld: 5.3 % of total Hgb (ref <5.7)
Mean Plasma Glucose: 105 (calc)
eAG (mmol/L): 5.8 (calc)

## 2020-04-13 LAB — PSA: PSA: 1 ng/mL (ref <=4.0)

## 2020-04-13 LAB — TSH: TSH: 4.77 mIU/L — ABNORMAL HIGH (ref 0.40–4.50)

## 2020-04-19 ENCOUNTER — Ambulatory Visit (INDEPENDENT_AMBULATORY_CARE_PROVIDER_SITE_OTHER): Payer: Medicare Other | Admitting: Family Medicine

## 2020-04-19 ENCOUNTER — Ambulatory Visit (LOCAL_COMMUNITY_HEALTH_CENTER): Payer: Self-pay

## 2020-04-19 ENCOUNTER — Other Ambulatory Visit: Payer: Self-pay

## 2020-04-19 ENCOUNTER — Encounter: Payer: Self-pay | Admitting: Family Medicine

## 2020-04-19 VITALS — Ht 74.0 in | Wt 258.0 lb

## 2020-04-19 VITALS — BP 128/71 | HR 73 | Temp 98.4°F | Resp 16 | Ht 74.0 in | Wt 258.0 lb

## 2020-04-19 DIAGNOSIS — L4050 Arthropathic psoriasis, unspecified: Secondary | ICD-10-CM

## 2020-04-19 DIAGNOSIS — I129 Hypertensive chronic kidney disease with stage 1 through stage 4 chronic kidney disease, or unspecified chronic kidney disease: Secondary | ICD-10-CM

## 2020-04-19 DIAGNOSIS — I739 Peripheral vascular disease, unspecified: Secondary | ICD-10-CM | POA: Diagnosis not present

## 2020-04-19 DIAGNOSIS — N183 Chronic kidney disease, stage 3 unspecified: Secondary | ICD-10-CM

## 2020-04-19 DIAGNOSIS — I482 Chronic atrial fibrillation, unspecified: Secondary | ICD-10-CM

## 2020-04-19 DIAGNOSIS — G4733 Obstructive sleep apnea (adult) (pediatric): Secondary | ICD-10-CM

## 2020-04-19 DIAGNOSIS — Z0000 Encounter for general adult medical examination without abnormal findings: Secondary | ICD-10-CM | POA: Diagnosis not present

## 2020-04-19 DIAGNOSIS — M170 Bilateral primary osteoarthritis of knee: Secondary | ICD-10-CM | POA: Diagnosis not present

## 2020-04-19 DIAGNOSIS — G609 Hereditary and idiopathic neuropathy, unspecified: Secondary | ICD-10-CM

## 2020-04-19 DIAGNOSIS — Z9989 Dependence on other enabling machines and devices: Secondary | ICD-10-CM

## 2020-04-19 DIAGNOSIS — R7612 Nonspecific reaction to cell mediated immunity measurement of gamma interferon antigen response without active tuberculosis: Secondary | ICD-10-CM

## 2020-04-19 NOTE — Patient Instructions (Addendum)
Thank you for coming to the office today.  Recommend ECHOcardiogram after having concern with enlarged heart on Chest X-ray.  Follow up with Dr Posey Pronto for an alternative to the Enbrel.  Elevated Triglyerides, keep on high dose Fish Oil for now  Reconsider Statin.  Please schedule a Follow-up Appointment to: Return in about 6 months (around 10/18/2020) for 6 month follow-up Rheumatology / Cardiology.  If you have any other questions or concerns, please feel free to call the office or send a message through [place]. You may also schedule an earlier appointment if necessary.  Additionally, you may be receiving a survey about your experience at our office within a few days to 1 week by e-mail or mail. We value your feedback.  Nobie Putnam, DO [facility]

## 2020-04-19 NOTE — Progress Notes (Addendum)
EPI completed via phone. Patient referred from [facility] for +QFT.  [place] CXR completed on 04/07/20; will have Dr review. Labs can be reviewed in [place]. Patient reports his heart is enlarged and has appt 04/19/20 for primary Dr.  Discussed LTBI vs Active TB.  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  TB RN will discuss patient's hx with MD. Explained that TB RN will call patient back to f/u after PCP appt re: enlarged heart.Aileen Fass, RN

## 2020-04-19 NOTE — Progress Notes (Signed)
Subjective:    Patient ID: Daniel Brandt, male    DOB: 04-Jan-1934, 86 y.o.   MRN: [id number]  Daniel Brandt is a 86 y.o. male presenting on 04/19/2020 for Annual Exam   HPI   Here for Annual Physical and Lab Review.  Psoriatic Arthritis Followed by Dr Salvatore Marvel Posey Pronto [facility] - He had been given diagnosis of Psoriatic Arthritis, instead of dermatomyositis, given rx of Enbrel but patient declined to start it. He was referred to Dermatology, and they have prescribed ointment for hands, clobetasol 0.01% with improvement. He also had a positive TB test and had CXR negative for TB (historical TB issue reported by patient), and then found enlarged heart on CXR.  - He will f/u with Dr Posey Pronto in December 2021  Chronic Back Pain / Lumbar DDD with spinal stenosis History of prior back fracture. 1995 had lumbar back surgery. Had complication 1 year ago, and was offered repeat surgeon  - Followed by [facility] - followed by Dr Sharlet Salina and Allene Dillon NP, they are doing  - He has difficulty with numbness in lower extremity and some weakness, now he says pain in back is more dull and less often. He has improved sensation in Right lower leg now. - Prior knee replacement surgery, other injuries.  Atrial Fibrillation / CHRONIC HTN: Reports no new concerns. No episodic AFib problems. Followed by Dr Ubaldo Glassing [facility]) Current Meds - Metoprolol XL 100mg , Olmesartan 40mg  daily Reports good compliance, took meds today. Tolerating well, w/o complaints. Denies CP, dyspnea, HA, edema, dizziness / lightheadedness  Hyperlipidemia / Elevated TG Followed by Dr Ubaldo Glassing [facility] - Reports no concerns. Last lipid panel 04/2020 elevated TG 236 - Taking Omega 3 2,000 twice a day Off statin  Health Maintenance: Decline flu vaccine  Depression screen [facility] 2/9 04/19/2020 10/20/2019 10/13/2019  Decreased Interest 0 0 0  Down, Depressed, Hopeless 0 0 0  PHQ - 2  Score 0 0 0  Altered sleeping - - -  Tired, decreased energy - - -  Change in appetite - - -  Feeling bad or failure about yourself  - - -  Trouble concentrating - - -  Moving slowly or fidgety/restless - - -  Suicidal thoughts - - -  PHQ-9 Score - - -  Difficult doing work/chores - - -    Past Medical History:  Diagnosis Date  . A-fib ([place])   . Arthritis   . FH: heart failure 10/04/2016  . Former smoker   . Heart murmur   . Hypertension   . Malaria   . Stroke [facility])    Past Surgical History:  Procedure Laterality Date  . COLONOSCOPY  06/12/2016  . FRACTURE SURGERY    . KNEE REPLACE Bilateral 2017  . LUMBAR FUSION  1995   L3 L4   Social History   Socioeconomic History  . Marital status: Married    Spouse name: Not on file  . Number of children: 6  . Years of education: Not on file  . Highest education level: Associate degree: academic program  Occupational History  . Occupation: Retired  Tobacco Use  . Smoking status: Former Smoker    Packs/day: 1.00    Years: 0.50    Pack years: 0.50    Types: Cigarettes    Quit date: 10/04/1956    Years since quitting: 63.5  . Smokeless tobacco: Former Systems developer  . Tobacco comment: smoking cessation materials not required  Vaping Use  .  Vaping Use: Never used  Substance and Sexual Activity  . Alcohol use: Yes    Alcohol/week: 28.0 standard drinks    Types: 28 Shots of liquor per week    Comment: Scotch  . Drug use: No  . Sexual activity: Not Currently  Other Topics Concern  . Not on file  Social History Narrative  . Not on file   Social Determinants of Health   Financial Resource Strain: Low Risk   . Difficulty of Paying Living Expenses: Not hard at all  Food Insecurity:   . Worried About Charity fundraiser in the Last Year: Not on file  . Ran Out of Food in the Last Year: Not on file  Transportation Needs: No Transportation Needs  . Lack of Transportation (Medical): No  . Lack of Transportation (Non-Medical): No    Physical Activity:   . Days of Exercise per Week: Not on file  . Minutes of Exercise per Session: Not on file  Stress:   . Feeling of Stress : Not on file  Social Connections: Moderately Integrated  . Frequency of Communication with Friends and Family: More than three times a week  . Frequency of Social Gatherings with Friends and Family: More than three times a week  . Attends Religious Services: Never  . Active Member of Clubs or Organizations: Yes  . Attends Archivist Meetings: More than 4 times per year  . Marital Status: Married  Human resources officer Violence:   . Fear of Current or Ex-Partner: Not on file  . Emotionally Abused: Not on file  . Physically Abused: Not on file  . Sexually Abused: Not on file   Family History  Problem Relation Age of Onset  . Heart disease Mother   . Heart disease Father   . Gout Sister   . Heart attack Son    Current Outpatient Medications on File Prior to Visit  Medication Sig  . aspirin 81 MG tablet Take 1 tablet (81 mg total) by mouth daily.  . metoprolol succinate (TOPROL-XL) 100 MG 24 hr tablet Take 1 tablet (100 mg total) by mouth daily. Take with or immediately following a meal.  . Multiple Vitamins-Minerals (MULTIVITAMIN WITH MINERALS) tablet Take 1 tablet by mouth daily.  . NON FORMULARY CPAP nightly  . olmesartan (BENICAR) 40 MG tablet Take 1 tablet (40 mg total) by mouth daily.  . Omega-3 Fatty Acids (FISH OIL) 1000 MG CAPS Take 2 capsules by mouth 2 (two) times daily.  . vitamin B-12 (CYANOCOBALAMIN) 500 MCG tablet Take 500 mcg by mouth daily.   No current facility-administered medications on file prior to visit.    Review of Systems  Constitutional: Negative for activity change, appetite change, chills, diaphoresis, fatigue and fever.  HENT: Negative for congestion and hearing loss.   Eyes: Negative for visual disturbance.  Respiratory: Negative for cough, chest tightness, shortness of breath and wheezing.    Cardiovascular: Negative for chest pain, palpitations and leg swelling.  Gastrointestinal: Negative for abdominal pain, constipation, diarrhea, nausea and vomiting.  Endocrine: Negative for cold intolerance.  Genitourinary: Negative for dysuria, frequency and hematuria.  Musculoskeletal: Negative for arthralgias and neck pain.  Skin: Negative for rash.  Allergic/Immunologic: Negative for environmental allergies.  Neurological: Negative for dizziness, weakness, light-headedness, numbness and headaches.  Hematological: Negative for adenopathy.  Psychiatric/Behavioral: Negative for behavioral problems, dysphoric mood and sleep disturbance.   Per HPI unless specifically indicated above     Objective:    BP 128/71  Pulse 73   Temp 98.4 F (36.9 C) (Temporal)   Resp 16   Ht 6\' 2"  (1.88 m)   Wt 258 lb (117 kg)   SpO2 97%   BMI 33.13 kg/m   Wt Readings from Last 3 Encounters:  04/19/20 258 lb (117 kg)  04/19/20 258 lb (117 kg)  03/01/20 258 lb 9.6 oz (117.3 kg)    Physical Exam Vitals and nursing note reviewed.  Constitutional:      General: He is not in acute distress.    Appearance: He is well-developed. He is not diaphoretic.     Comments: Well-appearing, comfortable, cooperative  HENT:     Head: Normocephalic and atraumatic.  Eyes:     General:        Right eye: No discharge.        Left eye: No discharge.     Conjunctiva/sclera: Conjunctivae normal.     Pupils: Pupils are equal, round, and reactive to light.  Neck:     Thyroid: No thyromegaly.  Cardiovascular:     Rate and Rhythm: Normal rate and regular rhythm.     Heart sounds: Normal heart sounds. No murmur heard.   Pulmonary:     Effort: Pulmonary effort is normal. No respiratory distress.     Breath sounds: Normal breath sounds. No wheezing or rales.  Abdominal:     General: Bowel sounds are normal. There is no distension.     Palpations: Abdomen is soft. There is no mass.     Tenderness: There is no  abdominal tenderness.  Musculoskeletal:        General: No tenderness. Normal range of motion.     Cervical back: Normal range of motion and neck supple.     Right lower leg: No edema.     Left lower leg: No edema.     Comments: Upper / Lower Extremities: - Normal muscle tone, strength bilateral upper extremities 5/5, lower extremities 5/5  Lymphadenopathy:     Cervical: No cervical adenopathy.  Skin:    General: Skin is warm and dry.     Findings: No erythema or rash.  Neurological:     Mental Status: He is alert and oriented to person, place, and time.     Comments: Distal sensation intact to light touch all extremities  Psychiatric:        Behavior: Behavior normal.     Comments: Well groomed, good eye contact, normal speech and thoughts    Results for orders placed or performed in visit on 04/11/20  TSH  Result Value Ref Range   TSH 4.77 (H) 0.40 - 4.50 mIU/L  PSA  Result Value Ref Range   PSA 1.00 < OR = 4.0 ng/mL  Lipid panel  Result Value Ref Range   Cholesterol 152 <200 mg/dL   HDL 41 > OR = 40 mg/dL   Triglycerides 236 (H) <150 mg/dL   LDL Cholesterol (Calc) 78 mg/dL (calc)   Total CHOL/HDL Ratio 3.7 <5.0 (calc)   Non-HDL Cholesterol (Calc) 111 <130 mg/dL (calc)  COMPLETE METABOLIC PANEL WITH GFR  Result Value Ref Range   Glucose, Bld 93 65 - 99 mg/dL   BUN 14 7 - 25 mg/dL   Creat 1.20 (H) 0.70 - 1.11 mg/dL   GFR, Est Non African American 56 (L) > OR = 60 mL/min/1.77m2   GFR, Est African American 64 > OR = 60 mL/min/1.57m2   BUN/Creatinine Ratio 12 6 - 22 (calc)   Sodium 138 135 -  146 mmol/L   Potassium 4.2 3.5 - 5.3 mmol/L   Chloride 104 98 - 110 mmol/L   CO2 23 20 - 32 mmol/L   Calcium 9.0 8.6 - 10.3 mg/dL   Total Protein 6.3 6.1 - 8.1 g/dL   Albumin 4.1 3.6 - 5.1 g/dL   Globulin 2.2 1.9 - 3.7 g/dL (calc)   AG Ratio 1.9 1.0 - 2.5 (calc)   Total Bilirubin 0.7 0.2 - 1.2 mg/dL   Alkaline phosphatase (APISO) 50 35 - 144 U/L   AST 23 10 - 35 U/L   ALT 22  9 - 46 U/L  CBC with Differential/Platelet  Result Value Ref Range   WBC 4.4 3.8 - 10.8 Thousand/uL   RBC 3.86 (L) 4.20 - 5.80 Million/uL   Hemoglobin 14.0 13.2 - 17.1 g/dL   HCT 40.7 38 - 50 %   MCV 105.4 (H) 80.0 - 100.0 fL   MCH 36.3 (H) 27.0 - 33.0 pg   MCHC 34.4 32.0 - 36.0 g/dL   RDW 12.9 11.0 - 15.0 %   Platelets 173 140 - 400 Thousand/uL   MPV 8.5 7.5 - 12.5 fL   Neutro Abs 2,438 1,500 - 7,800 cells/uL   Lymphs Abs 1,333 850 - 3,900 cells/uL   Absolute Monocytes 409 200 - 950 cells/uL   Eosinophils Absolute 211 15.0 - 500.0 cells/uL   Basophils Absolute 9 0.0 - 200.0 cells/uL   Neutrophils Relative % 55.4 %   Total Lymphocyte 30.3 %   Monocytes Relative 9.3 %   Eosinophils Relative 4.8 %   Basophils Relative 0.2 %  Hemoglobin A1c  Result Value Ref Range   Hgb A1c MFr Bld 5.3 <5.7 % of total Hgb   Mean Plasma Glucose 105 (calc)   eAG (mmol/L) 5.8 (calc)      Assessment & Plan:   Problem List Items Addressed This Visit    PVD (peripheral vascular disease) ([place])    Stable chronic problem      Psoriatic arthritis (HCC)    Followed by Rheumatology Declined Enbrel will follow-up with next line option      Peripheral neuropathy, idiopathic    Chronic neuropathy Followed by [facility] Neurosurgery/pain      Osteoarthritis of both knees   OSA on CPAP    Stable chronic OSA on CPAP      Chronic atrial fibrillation    Followed by Dr Ubaldo Glassing Cards Not on anticoagulation      Benign hypertension with CKD (chronic kidney disease) stage III (HCC)    Well-controlled HTN - Home BP readings  Followed by Dr Ubaldo Glassing   Plan:  1. Continue current BP regimen Metoprolol XL 100mg , Olmesartan 40mg  daily 2. Encourage improved lifestyle - low sodium diet, regular exercise 3. Continue monitor BP outside office, bring readings to next visit, if persistently >140/90 or new symptoms notify office sooner       Other Visit Diagnoses    Annual physical exam    -  Primary     Updated  Health Maintenance information Reviewed recent lab results with patient Encouraged improvement to lifestyle with diet and exercise - Goal of weight loss  #Chronic back pain, Lumbar DDD Spinal stenosis Followed by [facility] Neurosurgery/pain - Dr Sharlet Salina and Loree Fee Meeler NP Improved on ESI Injection treatment    No orders of the defined types were placed in this encounter.    Follow up plan: Return in about 6 months (around 10/18/2020) for 6 month follow-up Rheumatology / Cardiology.  Nobie Putnam,  DO [facility] 04/19/2020, 9:39 AM

## 2020-04-20 NOTE — Assessment & Plan Note (Signed)
Followed by Dr Ubaldo Glassing Cards Not on anticoagulation

## 2020-04-20 NOTE — Assessment & Plan Note (Signed)
Followed by Rheumatology Declined Enbrel will follow-up with next line option

## 2020-04-20 NOTE — Assessment & Plan Note (Signed)
Stable chronic OSA on CPAP

## 2020-04-20 NOTE — Assessment & Plan Note (Signed)
Chronic neuropathy Followed by [facility] Neurosurgery/pain

## 2020-04-20 NOTE — Assessment & Plan Note (Signed)
Well-controlled HTN - Home BP readings  Followed by Dr Ubaldo Glassing   Plan:  1. Continue current BP regimen Metoprolol XL 100mg , Olmesartan 40mg  daily 2. Encourage improved lifestyle - low sodium diet, regular exercise 3. Continue monitor BP outside office, bring readings to next visit, if persistently >140/90 or new symptoms notify office sooner

## 2020-04-20 NOTE — Assessment & Plan Note (Signed)
Stable chronic problem 

## 2020-05-03 ENCOUNTER — Other Ambulatory Visit: Payer: Self-pay

## 2020-05-03 NOTE — Progress Notes (Signed)
Tuberculosis treatment orders  All patients are to be monitored per [place] and county TB policies.   Kerri Asche has latent TB. Treat for latent TB per the following:  Rifampin 600mg  daily by mouth x 4 months, draw LFTs monthly per Dr. Ernestina Patches.  Draw CBC w/ diff and LFTs at [facility] start appt. Offer HIV.   +QFT and CXR without Active TB on 04/07/20 ([facility]- see scanned media); other labs located in care everywhere from [facility] [address]  Referred from [facility] [place]

## 2020-05-09 ENCOUNTER — Other Ambulatory Visit: Payer: Self-pay

## 2020-05-09 ENCOUNTER — Ambulatory Visit (LOCAL_COMMUNITY_HEALTH_CENTER): Payer: Medicare Other

## 2020-05-09 VITALS — Wt 262.5 lb

## 2020-05-09 DIAGNOSIS — R7612 Nonspecific reaction to cell mediated immunity measurement of gamma interferon antigen response without active tuberculosis: Secondary | ICD-10-CM

## 2020-05-09 MED ORDER — RIFAMPIN 300 MG PO CAPS: 600.0000 mg | ORAL_CAPSULE | Freq: Every day | ORAL | 0 refills | Status: AC

## 2020-05-09 NOTE — Progress Notes (Addendum)
Here for TB Med start with Rifampin #1 bottle. Rifampin 300mg  #60 dispensed with instructions to take 2 capsules daily by mouth, per order by Dr. Ernestina Patches dated 05/09/2020.  TB coord. Contact card, Rifampin info sheet given and explained. Pt reports drinking alcohol daily, but says he is committed to taking Rifampin for 4 mo. And plans to stop drinking. Return appt given for 06/06/20, arrival 10 am. LTBI  Treatment consent and ROI for [facility] signed. PCP LTBI letter faxed to [facility] successfully.  Pt's questions answered and reports understanding. Sent to lab for LFT's and CBC with diff. HIV testing declined today. Josie Saunders, RN

## 2020-05-10 LAB — CBC WITH DIFFERENTIAL/PLATELET
Basophils Absolute: 0 10*3/uL (ref 0.0–0.2)
Basos: 0 %
EOS (ABSOLUTE): 0.2 10*3/uL (ref 0.0–0.4)
Eos: 2 %
Hematocrit: 38.8 % (ref 37.5–51.0)
Hemoglobin: 13.4 g/dL (ref 13.0–17.7)
Immature Grans (Abs): 0 10*3/uL (ref 0.0–0.1)
Immature Granulocytes: 1 %
Lymphocytes Absolute: 1.4 10*3/uL (ref 0.7–3.1)
Lymphs: 23 %
MCH: 35.7 pg — ABNORMAL HIGH (ref 26.6–33.0)
MCHC: 34.5 g/dL (ref 31.5–35.7)
MCV: 104 fL — ABNORMAL HIGH (ref 79–97)
Monocytes Absolute: 0.8 10*3/uL (ref 0.1–0.9)
Monocytes: 12 %
Neutrophils Absolute: 3.8 10*3/uL (ref 1.4–7.0)
Neutrophils: 62 %
Platelets: 192 10*3/uL (ref 150–450)
RBC: 3.75 x10E6/uL — ABNORMAL LOW (ref 4.14–5.80)
RDW: 12.6 % (ref 11.6–15.4)
WBC: 6.2 10*3/uL (ref 3.4–10.8)

## 2020-05-10 LAB — HEPATIC FUNCTION PANEL
ALT: 24 IU/L (ref 0–44)
AST: 22 IU/L (ref 0–40)
Albumin: 4.3 g/dL (ref 3.6–4.6)
Alkaline Phosphatase: 55 IU/L (ref 44–121)
Bilirubin Total: 0.8 mg/dL (ref 0.0–1.2)
Bilirubin, Direct: 0.2 mg/dL (ref 0.00–0.40)
Total Protein: 6.4 g/dL (ref 6.0–8.5)

## 2020-06-09 ENCOUNTER — Telehealth: Payer: Self-pay | Admitting: Family Medicine

## 2020-06-09 NOTE — Telephone Encounter (Signed)
Rx for TB is almost out, please call to advise.

## 2020-06-14 ENCOUNTER — Ambulatory Visit (LOCAL_COMMUNITY_HEALTH_CENTER): Payer: Medicare Other

## 2020-06-14 ENCOUNTER — Other Ambulatory Visit: Payer: Self-pay

## 2020-06-14 VITALS — Wt 260.0 lb

## 2020-06-14 DIAGNOSIS — R7612 Nonspecific reaction to cell mediated immunity measurement of gamma interferon antigen response without active tuberculosis: Secondary | ICD-10-CM

## 2020-06-14 MED ORDER — RIFAMPIN 300 MG PO CAPS: 600.0000 mg | ORAL_CAPSULE | Freq: Every day | ORAL | 0 refills | Status: AC

## 2020-06-14 NOTE — Progress Notes (Signed)
Rifampin 300mg  #60 dispensed per [facility] order. Aileen Fass, RN

## 2020-06-15 LAB — HEPATIC FUNCTION PANEL
ALT: 21 IU/L (ref 0–44)
AST: 19 IU/L (ref 0–40)
Albumin: 4.4 g/dL (ref 3.6–4.6)
Alkaline Phosphatase: 55 IU/L (ref 44–121)
Bilirubin Total: 0.2 mg/dL (ref 0.0–1.2)
Bilirubin, Direct: <0.1 mg/dL (ref 0.00–0.40)
Total Protein: 6.5 g/dL (ref 6.0–8.5)

## 2020-07-04 DIAGNOSIS — L4050 Arthropathic psoriasis, unspecified: Secondary | ICD-10-CM | POA: Diagnosis not present

## 2020-07-04 DIAGNOSIS — L409 Psoriasis, unspecified: Secondary | ICD-10-CM | POA: Diagnosis not present

## 2020-07-07 ENCOUNTER — Other Ambulatory Visit: Payer: Self-pay

## 2020-07-07 ENCOUNTER — Ambulatory Visit (LOCAL_COMMUNITY_HEALTH_CENTER): Payer: Medicare Other

## 2020-07-07 VITALS — Wt 258.8 lb

## 2020-07-07 DIAGNOSIS — R7612 Nonspecific reaction to cell mediated immunity measurement of gamma interferon antigen response without active tuberculosis: Secondary | ICD-10-CM

## 2020-07-07 MED ORDER — RIFAMPIN 300 MG PO CAPS: 600.0000 mg | ORAL_CAPSULE | Freq: Every day | ORAL | 1 refills | Status: AC

## 2020-07-07 NOTE — Progress Notes (Signed)
Patient sent to the lab today for his monthly LFT's.  Marcelino Duster to call patient for his 4 week return appointment due to February profiles not available. Hart Carwin, RN

## 2020-07-08 LAB — HEPATIC FUNCTION PANEL
ALT: 19 IU/L (ref 0–44)
AST: 16 IU/L (ref 0–40)
Albumin: 4.1 g/dL (ref 3.6–4.6)
Alkaline Phosphatase: 51 IU/L (ref 44–121)
Bilirubin Total: 0.3 mg/dL (ref 0.0–1.2)
Bilirubin, Direct: 0.12 mg/dL (ref 0.00–0.40)
Total Protein: 6.1 g/dL (ref 6.0–8.5)

## 2020-08-10 ENCOUNTER — Other Ambulatory Visit: Payer: Self-pay

## 2020-08-10 ENCOUNTER — Ambulatory Visit (LOCAL_COMMUNITY_HEALTH_CENTER): Payer: Medicare Other

## 2020-08-10 VITALS — Wt 248.0 lb

## 2020-08-10 DIAGNOSIS — R7612 Nonspecific reaction to cell mediated immunity measurement of gamma interferon antigen response without active tuberculosis: Secondary | ICD-10-CM

## 2020-08-10 DIAGNOSIS — Z227 Latent tuberculosis: Secondary | ICD-10-CM

## 2020-08-10 MED ORDER — RIFAMPIN 300 MG PO CAPS: 600.0000 mg | ORAL_CAPSULE | Freq: Every day | ORAL | 0 refills | Status: AC

## 2020-08-10 NOTE — Progress Notes (Signed)
Bottle #4 of Rifampin 300 mg (#60) to take 2 capsules at same time daily dispensed per 05/10/2020 signed order of Dr. Lauretta Chester. Client completion letter and yellow completion card given. Client to write in actual completion date on both letters when finishes last Rifampin dose. Per client has 2 doses of Rifampin remaining at home. LFT's drawn today. Per client request, call made to [place] Director as client requesting to talk with him regarding Covid. Client aware that RN had to leave message on Director's office phone as currently not in office. Client completion letter mailed to Dr. Rennis Petty and copy sent for scanning. Rich Number, RN

## 2020-08-11 LAB — HEPATIC FUNCTION PANEL
ALT: 18 IU/L (ref 0–44)
AST: 19 IU/L (ref 0–40)
Albumin: 4.3 g/dL (ref 3.6–4.6)
Alkaline Phosphatase: 61 IU/L (ref 44–121)
Bilirubin Total: 0.3 mg/dL (ref 0.0–1.2)
Bilirubin, Direct: 0.13 mg/dL (ref 0.00–0.40)
Total Protein: 6.5 g/dL (ref 6.0–8.5)

## 2020-08-12 DIAGNOSIS — Z227 Latent tuberculosis: Secondary | ICD-10-CM | POA: Insufficient documentation

## 2020-10-04 DIAGNOSIS — I8311 Varicose veins of right lower extremity with inflammation: Secondary | ICD-10-CM | POA: Diagnosis not present

## 2020-10-04 DIAGNOSIS — L400 Psoriasis vulgaris: Secondary | ICD-10-CM | POA: Diagnosis not present

## 2020-10-04 DIAGNOSIS — I8312 Varicose veins of left lower extremity with inflammation: Secondary | ICD-10-CM | POA: Diagnosis not present

## 2020-10-04 DIAGNOSIS — L4050 Arthropathic psoriasis, unspecified: Secondary | ICD-10-CM | POA: Diagnosis not present

## 2020-11-02 DIAGNOSIS — L409 Psoriasis, unspecified: Secondary | ICD-10-CM | POA: Diagnosis not present

## 2020-11-02 DIAGNOSIS — L4050 Arthropathic psoriasis, unspecified: Secondary | ICD-10-CM | POA: Diagnosis not present

## 2020-11-02 DIAGNOSIS — Z79899 Other long term (current) drug therapy: Secondary | ICD-10-CM | POA: Diagnosis not present

## 2020-11-14 ENCOUNTER — Other Ambulatory Visit: Payer: Self-pay | Admitting: Family Medicine

## 2020-11-14 DIAGNOSIS — I482 Chronic atrial fibrillation, unspecified: Secondary | ICD-10-CM

## 2020-11-14 DIAGNOSIS — N183 Chronic kidney disease, stage 3 unspecified: Secondary | ICD-10-CM

## 2020-11-14 NOTE — Telephone Encounter (Signed)
Requested medication (s) are due for refill today: Yes  Requested medication (s) are on the active medication list: Yes  Last refill:  10/13/19  Future visit scheduled: No  Notes to clinic:  Prescription has expired.    Requested Prescriptions  Pending Prescriptions Disp Refills   metoprolol succinate (TOPROL-XL) 100 MG 24 hr tablet [Pharmacy Med Name: METOPROLOL ER SUCCINATE 100MG  TABS] 90 tablet 3    Sig: TAKE 1 TABLET(100 MG) BY MOUTH DAILY WITH OR IMMEDIATELY FOLLOWING A MEAL      Cardiovascular:  Beta Blockers Failed - 11/14/2020  3:20 AM      Failed - Valid encounter within last 6 months    Recent Outpatient Visits           6 months ago Annual physical exam   [facility] Olin Hauser, DO   8 months ago Dermatomyositis [facility])   [facility] Olin Hauser, DO   1 year ago Benign hypertension with CKD (chronic kidney disease) stage III   [place], DO   2 years ago Annual physical exam   [facility] Glean Hess, MD   2 years ago Fatigue, unspecified type   [facility] Glean Hess, MD                Passed - Last BP in normal range    BP Readings from Last 1 Encounters:  04/19/20 128/71          Passed - Last Heart Rate in normal range    Pulse Readings from Last 1 Encounters:  04/19/20 73

## 2020-11-29 ENCOUNTER — Ambulatory Visit (INDEPENDENT_AMBULATORY_CARE_PROVIDER_SITE_OTHER): Payer: Medicare Other

## 2020-11-29 VITALS — Ht 74.0 in | Wt 250.6 lb

## 2020-11-29 DIAGNOSIS — Z0000 Encounter for general adult medical examination without abnormal findings: Secondary | ICD-10-CM | POA: Diagnosis not present

## 2020-11-29 NOTE — Patient Instructions (Signed)
Daniel Brandt , Thank you for taking time to come for your Medicare Wellness Visit. I appreciate your ongoing commitment to your health goals. Please review the following plan we discussed and let me know if I can assist you in the future.   Screening recommendations/referrals: Colonoscopy: not required Recommended yearly ophthalmology/optometry visit for glaucoma screening and checkup Recommended yearly dental visit for hygiene and checkup  Vaccinations: Influenza vaccine: decline Pneumococcal vaccine: completed 10/09/2017 Tdap vaccine: completed 10/04/2016, due 10/05/2026 Shingles vaccine: completed   Covid-19: 09/06/2019  Advanced directives: Please bring a copy of your POA (Power of [place]) and/or Living Will to your next appointment.   Conditions/risks identified: none  Next appointment: Follow up in one year for your annual wellness visit.   Preventive Care 85 Years and Older, Male Preventive care refers to lifestyle choices and visits with your health care provider that can promote health and wellness. What does preventive care include?  A yearly physical exam. This is also called an annual well check.  Dental exams once or twice a year.  Routine eye exams. Ask your health care provider how often you should have your eyes checked.  Personal lifestyle choices, including:  Daily care of your teeth and gums.  Regular physical activity.  Eating a healthy diet.  Avoiding tobacco and drug use.  Limiting alcohol use.  Practicing safe sex.  Taking low doses of aspirin every day.  Taking vitamin and mineral supplements as recommended by your health care provider. What happens during an annual well check? The services and screenings done by your health care provider during your annual well check will depend on your age, overall health, lifestyle risk factors, and family history of disease. Counseling  Your health care provider may ask you questions about your:  Alcohol  use.  Tobacco use.  Drug use.  Emotional well-being.  Home and relationship well-being.  Sexual activity.  Eating habits.  History of falls.  Memory and ability to understand (cognition).  Work and work Statistician. Screening  You may have the following tests or measurements:  Height, weight, and BMI.  Blood pressure.  Lipid and cholesterol levels. These may be checked every 5 years, or more frequently if you are over 68 years old.  Skin check.  Lung cancer screening. You may have this screening every year starting at age 85 if you have a 30-pack-year history of smoking and currently smoke or have quit within the past 15 years.  Fecal occult blood test (FOBT) of the stool. You may have this test every year starting at age 35.  Flexible sigmoidoscopy or colonoscopy. You may have a sigmoidoscopy every 5 years or a colonoscopy every 10 years starting at age 85.  Prostate cancer screening. Recommendations will vary depending on your family history and other risks.  Hepatitis C blood test.  Hepatitis B blood test.  Sexually transmitted disease (STD) testing.  Diabetes screening. This is done by checking your blood sugar (glucose) after you have not eaten for a while (fasting). You may have this done every 1-3 years.  Abdominal aortic aneurysm (AAA) screening. You may need this if you are a current or former smoker.  Osteoporosis. You may be screened starting at age 85 if you are at high risk. Talk with your health care provider about your test results, treatment options, and if necessary, the need for more tests. Vaccines  Your health care provider may recommend certain vaccines, such as:  Influenza vaccine. This is recommended every year.  Tetanus,  diphtheria, and acellular pertussis (Tdap, Td) vaccine. You may need a Td booster every 10 years.  Zoster vaccine. You may need this after age 85.  Pneumococcal 13-valent conjugate (PCV13) vaccine. One dose is  recommended after age 85.  Pneumococcal polysaccharide (PPSV23) vaccine. One dose is recommended after age 85. Talk to your health care provider about which screenings and vaccines you need and how often you need them. This information is not intended to replace advice given to you by your health care provider. Make sure you discuss any questions you have with your health care provider. Document Released: 07/22/2015 Document Revised: 03/14/2016 Document Reviewed: 04/26/2015 Elsevier Interactive Patient Education  2017 [place] Prevention in the Home Falls can cause injuries. They can happen to people of all ages. There are many things you can do to make your home safe and to help prevent falls. What can I do on the outside of my home?  Regularly fix the edges of walkways and driveways and fix any cracks.  Remove anything that might make you trip as you walk through a door, such as a raised step or threshold.  Trim any bushes or trees on the path to your home.  Use bright outdoor lighting.  Clear any walking paths of anything that might make someone trip, such as rocks or tools.  Regularly check to see if handrails are loose or broken. Make sure that both sides of any steps have handrails.  Any raised decks and porches should have guardrails on the edges.  Have any leaves, snow, or ice cleared regularly.  Use sand or salt on walking paths during winter.  Clean up any spills in your garage right away. This includes oil or grease spills. What can I do in the bathroom?  Use night lights.  Install grab bars by the toilet and in the tub and shower. Do not use towel bars as grab bars.  Use non-skid mats or decals in the tub or shower.  If you need to sit down in the shower, use a plastic, non-slip stool.  Keep the floor dry. Clean up any water that spills on the floor as soon as it happens.  Remove soap buildup in the tub or shower regularly.  Attach bath mats  securely with double-sided non-slip rug tape.  Do not have throw rugs and other things on the floor that can make you trip. What can I do in the bedroom?  Use night lights.  Make sure that you have a light by your bed that is easy to reach.  Do not use any sheets or blankets that are too big for your bed. They should not hang down onto the floor.  Have a firm chair that has side arms. You can use this for support while you get dressed.  Do not have throw rugs and other things on the floor that can make you trip. What can I do in the kitchen?  Clean up any spills right away.  Avoid walking on wet floors.  Keep items that you use a lot in easy-to-reach places.  If you need to reach something above you, use a strong step stool that has a grab bar.  Keep electrical cords out of the way.  Do not use floor polish or wax that makes floors slippery. If you must use wax, use non-skid floor wax.  Do not have throw rugs and other things on the floor that can make you trip. What can I do with  my stairs?  Do not leave any items on the stairs.  Make sure that there are handrails on both sides of the stairs and use them. Fix handrails that are broken or loose. Make sure that handrails are as long as the stairways.  Check any carpeting to make sure that it is firmly attached to the stairs. Fix any carpet that is loose or worn.  Avoid having throw rugs at the top or bottom of the stairs. If you do have throw rugs, attach them to the floor with carpet tape.  Make sure that you have a light switch at the top of the stairs and the bottom of the stairs. If you do not have them, ask someone to add them for you. What else can I do to help prevent falls?  Wear shoes that:  Do not have high heels.  Have rubber bottoms.  Are comfortable and fit you well.  Are closed at the toe. Do not wear sandals.  If you use a stepladder:  Make sure that it is fully opened. Do not climb a closed  stepladder.  Make sure that both sides of the stepladder are locked into place.  Ask someone to hold it for you, if possible.  Clearly mark and make sure that you can see:  Any grab bars or handrails.  First and last steps.  Where the edge of each step is.  Use tools that help you move around (mobility aids) if they are needed. These include:  Canes.  Walkers.  Scooters.  Crutches.  Turn on the lights when you go into a dark area. Replace any light bulbs as soon as they burn out.  Set up your furniture so you have a clear path. Avoid moving your furniture around.  If any of your floors are uneven, fix them.  If there are any pets around you, be aware of where they are.  Review your medicines with your doctor. Some medicines can make you feel dizzy. This can increase your chance of falling. Ask your doctor what other things that you can do to help prevent falls. This information is not intended to replace advice given to you by your health care provider. Make sure you discuss any questions you have with your health care provider. Document Released: 04/21/2009 Document Revised: 12/01/2015 Document Reviewed: 07/30/2014 Elsevier Interactive Patient Education  2017 [employer].

## 2020-11-29 NOTE — Progress Notes (Signed)
I connected with Daniel Brandt today by telephone and verified that I am speaking with the correct person using two identifiers. Location patient: home Location provider: work Persons participating in the virtual visit: Lannis Lichtenwalner, Jeter Tomey (spouse), Glenna Durand LPN.   I discussed the limitations, risks, security and privacy concerns of performing an evaluation and management service by telephone and the availability of in person appointments. I also discussed with the patient that there may be a patient responsible charge related to this service. The patient expressed understanding and verbally consented to this telephonic visit.    Interactive audio and video telecommunications were attempted between this provider and patient, however failed, due to patient having technical difficulties OR patient did not have access to video capability.  We continued and completed visit with audio only.     Vital signs may be patient reported or missing.  Subjective:   Daniel Brandt is a 87 y.o. male who presents for Medicare Annual/Subsequent preventive examination.  Review of Systems     Cardiac Risk Factors include: advanced age (>68men, >109 women);hypertension;male gender;obesity (BMI >30kg/m2);sedentary lifestyle     Objective:    Today's Vitals   11/29/20 1137  Weight: 250 lb 9.6 oz (113.7 kg)  Height: 6\' 2"  (1.88 m)  PainSc: 4    Body mass index is 32.18 kg/m.  Advanced Directives 11/29/2020 10/20/2019 04/02/2018 04/01/2017 10/04/2016  Does Patient Have a Medical Advance Directive? Yes Yes Yes Yes Yes  Type of Paramedic of [place];Living will Living will;Healthcare Power of [place];Living will [place];Living will [place];Living will  Copy of [place] in Chart? No - copy requested No - copy requested No - copy requested No - copy requested -    Current  Medications (verified) Outpatient Encounter Medications as of 11/29/2020  Medication Sig  . aspirin 81 MG tablet Take 1 tablet (81 mg total) by mouth daily.  . metoprolol succinate (TOPROL-XL) 100 MG 24 hr tablet TAKE 1 TABLET(100 MG) BY MOUTH DAILY WITH OR IMMEDIATELY FOLLOWING A MEAL  . Multiple Vitamins-Minerals (MULTIVITAMIN WITH MINERALS) tablet Take 1 tablet by mouth daily.  . NON FORMULARY CPAP nightly  . olmesartan (BENICAR) 40 MG tablet Take 1 tablet (40 mg total) by mouth daily.  . Omega-3 Fatty Acids (FISH OIL) 1000 MG CAPS Take 2 capsules by mouth 2 (two) times daily.  . vitamin B-12 (CYANOCOBALAMIN) 500 MCG tablet Take 500 mcg by mouth daily.  Marland Kitchen ivermectin (STROMECTOL) 3 MG TABS tablet Take by mouth once. Patient reports taking 12 mg daily (Patient not taking: Reported on 11/29/2020)   No facility-administered encounter medications on file as of 11/29/2020.    Allergies (verified) Patient has no known allergies.   History: Past Medical History:  Diagnosis Date  . A-fib ([place])   . Arthritis   . FH: heart failure 10/04/2016  . Former smoker   . Heart murmur   . Hypertension   . Malaria   . Stroke ([place])   . TB lung, latent    Completed 4 months of LTBI tx with Rifampin ~09/09/2020   Past Surgical History:  Procedure Laterality Date  . COLONOSCOPY  06/12/2016  . FRACTURE SURGERY    . KNEE REPLACE Bilateral 2017  . LUMBAR FUSION  1995   L3 L4   Family History  Problem Relation Age of Onset  . Heart disease Mother   . Heart disease Father   . Gout Sister   .  Heart attack Son    Social History   Socioeconomic History  . Marital status: Married    Spouse name: Not on file  . Number of children: 6  . Years of education: Not on file  . Highest education level: Associate degree: academic program  Occupational History  . Occupation: Retired  Tobacco Use  . Smoking status: Former Smoker    Packs/day: 1.00    Years: 0.50    Pack years: 0.50    Types: Cigarettes     Quit date: 10/04/1956    Years since quitting: 64.1  . Smokeless tobacco: Former Systems developer  . Tobacco comment: smoking cessation materials not required  Vaping Use  . Vaping Use: Never used  Substance and Sexual Activity  . Alcohol use: Yes    Alcohol/week: 28.0 standard drinks    Types: 28 Shots of liquor per week    Comment: Scotch  . Drug use: No  . Sexual activity: Not Currently  Other Topics Concern  . Not on file  Social History Narrative  . Not on file   Social Determinants of Health   Financial Resource Strain: Low Risk   . Difficulty of Paying Living Expenses: Not hard at all  Food Insecurity: No Food Insecurity  . Worried About Charity fundraiser in the Last Year: Never true  . Ran Out of Food in the Last Year: Never true  Transportation Needs: No Transportation Needs  . Lack of Transportation (Medical): No  . Lack of Transportation (Non-Medical): No  Physical Activity: Inactive  . Days of Exercise per Week: 0 days  . Minutes of Exercise per Session: 0 min  Stress: No Stress Concern Present  . Feeling of Stress : Not at all  Social Connections: Not on file    Tobacco Counseling Counseling given: Not Answered Comment: smoking cessation materials not required   Clinical Intake:  Pre-visit preparation completed: Yes  Pain : 0-10 Pain Score: 4  Pain Type: Chronic pain Pain Location: Generalized Pain Descriptors / Indicators: Aching,Dull Pain Onset: More than a month ago Pain Frequency: Constant     Nutritional Status: BMI > 30  Obese Nutritional Risks: None Diabetes: No  How often do you need to have someone help you when you read instructions, pamphlets, or other written materials from your doctor or pharmacy?: 1 - Never What is the last grade level you completed in school?: 2 degrees  Diabetic? no  Interpreter Needed?: No  Information entered by :: NAllen LPN   Activities of Daily Living In your present state of health, do you have any  difficulty performing the following activities: 11/29/2020  Hearing? Y  Vision? Y  Comment things look dim  Difficulty concentrating or making decisions? Y  Walking or climbing stairs? Y  Dressing or bathing? N  Doing errands, shopping? N  Preparing Food and eating ? N  Using the Toilet? N  In the past six months, have you accidently leaked urine? N  Do you have problems with loss of bowel control? N  Managing your Medications? Y  Managing your Finances? N  Housekeeping or managing your Housekeeping? N  Some recent data might be hidden    Patient Care Team: Olin Hauser, DO as PCP - General (Family Medicine) Deanna Artis, MD as Referring Physician (Gastroenterology) Serita Grit, MD as Referring Physician (Dermatology)  Indicate any recent Medical Services you may have received from other than [facility] providers in the past year (date may be approximate).  Assessment:   This is a routine wellness examination for Jeryn.  Hearing/Vision screen No exam data present  Dietary issues and exercise activities discussed: Current Exercise Habits: The patient does not participate in regular exercise at present  Goals Addressed            This Visit's Progress   . Patient Stated       11/29/2020, no goals      Depression Screen PHQ 2/9 Scores 11/29/2020 04/19/2020 10/20/2019 10/13/2019 10/23/2018 09/04/2018 04/02/2018  PHQ - 2 Score 0 0 0 0 0 0 0  PHQ- 9 Score - - - - 0 - 0    Fall Risk Fall Risk  11/29/2020 04/19/2020 10/13/2019 10/23/2018 09/04/2018  Falls in the past year? 0 0 0 0 0  Number falls in past yr: - 0 0 0 0  Injury with Fall? - 0 0 0 0  Risk for fall due to : Medication side effect - - - -  Risk for fall due to: Comment - - - - -  Follow up Falls evaluation completed;Education provided;Falls prevention discussed Falls evaluation completed Falls evaluation completed - Falls evaluation completed    FALL RISK PREVENTION PERTAINING TO THE HOME:  Any stairs  in or around the home? No  If so, are there any without handrails? n/a Home free of loose throw rugs in walkways, pet beds, electrical cords, etc? Yes  Adequate lighting in your home to reduce risk of falls? Yes   ASSISTIVE DEVICES UTILIZED TO PREVENT FALLS:  Life alert? No  Use of a cane, walker or w/c? No  Grab bars in the bathroom? Yes  Shower chair or bench in shower? Yes  Elevated toilet seat or a handicapped toilet? No   TIMED UP AND GO:  Was the test performed? No .     Cognitive Function:     6CIT Screen 11/29/2020 10/20/2019 04/02/2018 04/01/2017  What Year? 0 points 0 points 0 points 0 points  What month? 0 points 0 points 0 points 0 points  What time? 0 points 0 points 0 points 0 points  Count back from 20 0 points 0 points 0 points 0 points  Months in reverse 0 points 0 points 0 points 0 points  Repeat phrase 0 points 0 points 0 points 0 points  Total Score 0 0 0 0    Immunizations Immunization History  Administered Date(s) Administered  . Influenza, High Dose Seasonal PF 04/01/2017, 04/02/2018  . Influenza,inj,Quad PF,6+ Mos 03/09/2016  . PFIZER(Purple Top)SARS-COV-2 Vaccination 09/06/2019  . Pneumococcal Conjugate-13 03/09/2016  . Pneumococcal Polysaccharide-23 10/09/2017  . Tdap 10/04/2016  . Zoster Recombinat (Shingrix) 08/20/2016, 12/21/2016    TDAP status: Up to date  Flu Vaccine status: Declined, Education has been provided regarding the importance of this vaccine but patient still declined. Advised may receive this vaccine at local pharmacy or Health Dept. Aware to provide a copy of the vaccination record if obtained from local pharmacy or Health Dept. Verbalized acceptance and understanding.  Pneumococcal vaccine status: Up to date  Covid-19 vaccine status: Declined, Education has been provided regarding the importance of this vaccine but patient still declined. Advised may receive this vaccine at local pharmacy or Health Dept.or vaccine clinic. Aware  to provide a copy of the vaccination record if obtained from local pharmacy or Health Dept. Verbalized acceptance and understanding.  Qualifies for Shingles Vaccine? Yes   Zostavax completed No   Shingrix Completed?: Yes  Screening Tests Health Maintenance  Topic Date Due  .  COVID-19 Vaccine (2 - Pfizer 3-dose series) 09/27/2019  . INFLUENZA VACCINE  02/06/2021  . TETANUS/TDAP  10/05/2026  . PNA vac Low Risk Adult  Completed  . HPV VACCINES  Aged Out    Health Maintenance  Health Maintenance Due  Topic Date Due  . COVID-19 Vaccine (2 - Pfizer 3-dose series) 09/27/2019    Colorectal cancer screening: No longer required.   Lung Cancer Screening: (Low Dose CT Chest recommended if Age 66-80 years, 30 pack-year currently smoking OR have quit w/in 15years.) does not qualify.   Lung Cancer Screening Referral: no  Additional Screening:  Hepatitis C Screening: does not qualify;   Vision Screening: Recommended annual ophthalmology exams for early detection of glaucoma and other disorders of the eye. Is the patient up to date with their annual eye exam?  No  Who is the provider or what is the name of the office in which the patient attends annual eye exams? [facility] If pt is not established with a provider, would they like to be referred to a provider to establish care? No .   Dental Screening: Recommended annual dental exams for proper oral hygiene  Community Resource Referral / Chronic Care Management: CRR required this visit?  No   CCM required this visit?  No      Plan:     I have personally reviewed and noted the following in the patient's chart:   . Medical and social history . Use of alcohol, tobacco or illicit drugs  . Current medications and supplements including opioid prescriptions. Patient is not currently taking opioid prescriptions. . Functional ability and status . Nutritional status . Physical activity . Advanced directives . List of other  physicians . Hospitalizations, surgeries, and ER visits in previous 12 months . Vitals . Screenings to include cognitive, depression, and falls . Referrals and appointments  In addition, I have reviewed and discussed with patient certain preventive protocols, quality metrics, and best practice recommendations. A written personalized care plan for preventive services as well as general preventive health recommendations were provided to patient.     Kellie Simmering, LPN   0/01/6225   Nurse Notes:

## 2021-01-04 ENCOUNTER — Emergency Department: Payer: Medicare Other

## 2021-01-04 ENCOUNTER — Emergency Department
Admission: EM | Admit: 2021-01-04 | Discharge: 2021-01-04 | Disposition: A | Discharge status: Home / Self Care | Payer: Medicare Other | Attending: Emergency Medicine | Admitting: Emergency Medicine

## 2021-01-04 ENCOUNTER — Other Ambulatory Visit: Payer: Self-pay

## 2021-01-04 DIAGNOSIS — S61210A Laceration without foreign body of right index finger without damage to nail, initial encounter: Secondary | ICD-10-CM | POA: Insufficient documentation

## 2021-01-04 DIAGNOSIS — S61250A Open bite of right index finger without damage to nail, initial encounter: Secondary | ICD-10-CM | POA: Insufficient documentation

## 2021-01-04 DIAGNOSIS — S6991XA Unspecified injury of right wrist, hand and finger(s), initial encounter: Secondary | ICD-10-CM | POA: Diagnosis present

## 2021-01-04 DIAGNOSIS — I129 Hypertensive chronic kidney disease with stage 1 through stage 4 chronic kidney disease, or unspecified chronic kidney disease: Secondary | ICD-10-CM | POA: Diagnosis not present

## 2021-01-04 DIAGNOSIS — Z7982 Long term (current) use of aspirin: Secondary | ICD-10-CM | POA: Diagnosis not present

## 2021-01-04 DIAGNOSIS — M7989 Other specified soft tissue disorders: Secondary | ICD-10-CM | POA: Diagnosis not present

## 2021-01-04 DIAGNOSIS — Z87891 Personal history of nicotine dependence: Secondary | ICD-10-CM | POA: Diagnosis not present

## 2021-01-04 DIAGNOSIS — Z79899 Other long term (current) drug therapy: Secondary | ICD-10-CM | POA: Diagnosis not present

## 2021-01-04 DIAGNOSIS — N183 Chronic kidney disease, stage 3 unspecified: Secondary | ICD-10-CM | POA: Insufficient documentation

## 2021-01-04 DIAGNOSIS — W540XXA Bitten by dog, initial encounter: Secondary | ICD-10-CM | POA: Diagnosis not present

## 2021-01-04 MED ORDER — LIDOCAINE HCL (PF) 1 % IJ SOLN
10.0000 mL | Freq: Once | INTRAMUSCULAR | Status: AC
  Filled 2021-01-04: qty 10

## 2021-01-04 MED ORDER — AMOXICILLIN-POT CLAVULANATE 875-125 MG PO TABS: 1.0000 | ORAL_TABLET | Freq: Two times a day (BID) | ORAL | 0 refills | Status: DC

## 2021-01-04 MED ORDER — LIDOCAINE HCL (PF) 1 % IJ SOLN
INTRAMUSCULAR | Status: AC
  Administered 2021-01-04: 10 mL
  Filled 2021-01-04: qty 5

## 2021-01-04 NOTE — ED Provider Notes (Signed)
[facility] Emergency Department Provider Note  ____________________________________________   Event Date/Time   First MD Initiated Contact with Patient 01/04/21 [phone]     (approximate)  I have reviewed the triage vital signs and the nursing notes.   HISTORY  Chief Complaint Animal Bite   HPI Daniel Brandt is a 87 y.o. male presents to the ED after being bit by his dog this morning.  Patient has a laceration to his right index finger.  Patient states that dog's rabies is up-to-date and records indicate that his tetanus was last given in 2018.  Currently patient rates his pain as 2/10.         Past Medical History:  Diagnosis Date   A-fib [facility])    Arthritis    FH: heart failure 10/04/2016   Former smoker    Heart murmur    Hypertension    Malaria    Stroke ([place])    TB lung, latent    Completed 4 months of LTBI tx with Rifampin ~09/09/2020    Patient Active Problem List   Diagnosis Date Noted   TB lung, latent    Nonspecific reaction to cell mediated immunity measurement of gamma interferon antigen response without active tuberculosis 06/14/2020   Spinal stenosis of lumbar region without neurogenic claudication 10/13/2019   DDD (degenerative disc disease), lumbar 10/13/2019   Eczema of lower leg 10/23/2018   Pain, lower extremity 10/23/2018   DJD (degenerative joint disease) 10/23/2018   Plantar fasciitis of left foot 05/19/2018   Peripheral neuropathy, idiopathic 04/23/2018   Environmental and seasonal allergies 04/23/2018   OSA on CPAP 10/09/2017   Ganglion cyst of finger of left hand 12/04/2016   Chronic atrial fibrillation 10/04/2016   PVD (peripheral vascular disease) ([place]) 10/04/2016   Benign hypertension with CKD (chronic kidney disease) stage III ([place]) 03/09/2016   Osteoarthritis of both knees 03/09/2016   Psoriatic arthritis ([place]) 03/09/2016    Past Surgical History:  Procedure Laterality Date   COLONOSCOPY  06/12/2016    FRACTURE SURGERY     KNEE REPLACE Bilateral 2017   LUMBAR FUSION  1995   L3 L4    Prior to Admission medications   Medication Sig Start Date End Date Taking? Authorizing Provider  amoxicillin-clavulanate (AUGMENTIN) 875-125 MG tablet Take 1 tablet by mouth 2 (two) times daily for 10 days. 01/04/21 01/14/21 Yes Johnn Hai, PA-C  aspirin 81 MG tablet Take 1 tablet (81 mg total) by mouth daily. 11/27/16   Plonk, Gwyndolyn Saxon, MD  ivermectin (STROMECTOL) 3 MG TABS tablet Take by mouth once. Patient reports taking 12 mg daily Patient not taking: Reported on 11/29/2020    [provider]  metoprolol succinate (TOPROL-XL) 100 MG 24 hr tablet TAKE 1 TABLET(100 MG) BY MOUTH DAILY WITH OR IMMEDIATELY FOLLOWING A MEAL 11/14/20   Karamalegos, Devonne Doughty, DO  Multiple Vitamins-Minerals (MULTIVITAMIN WITH MINERALS) tablet Take 1 tablet by mouth daily.    [provider]  NON FORMULARY CPAP nightly    [provider]  olmesartan (BENICAR) 40 MG tablet Take 1 tablet (40 mg total) by mouth daily. 03/01/20   Karamalegos, Devonne Doughty, DO  Omega-3 Fatty Acids (FISH OIL) 1000 MG CAPS Take 2 capsules by mouth 2 (two) times daily.    [provider]  vitamin B-12 (CYANOCOBALAMIN) 500 MCG tablet Take 500 mcg by mouth daily.    [provider]    Allergies Patient has no known allergies.  Family History  Problem Relation Age  of Onset   Heart disease Mother    Heart disease Father    Gout Sister    Heart attack Son     Social History Social History   Tobacco Use   Smoking status: Former    Packs/day: 1.00    Years: 0.50    Pack years: 0.50    Types: Cigarettes    Quit date: 10/04/1956    Years since quitting: 64.2   Smokeless tobacco: Former   Tobacco comments:    smoking cessation materials not required  Vaping Use   Vaping Use: Never used  Substance Use Topics   Alcohol use: Yes    Alcohol/week: 28.0 standard drinks    Types: 28 Shots of liquor per  week    Comment: Scotch   Drug use: No    Review of Systems Constitutional: No fever/chills Eyes: No visual changes. Cardiovascular: Denies chest pain. Respiratory: Denies shortness of breath. Genitourinary: Negative for dysuria. Musculoskeletal: Positive for right index finger pain. Skin: Positive for laceration. Neurological: Negative for headaches, focal weakness or numbness. ____________________________________________   PHYSICAL EXAM:  VITAL SIGNS: ED Triage Vitals  Enc Vitals Group     BP 01/04/21 0834 140/74     Pulse Rate 01/04/21 0834 100     Resp 01/04/21 0834 20     Temp 01/04/21 0834 97.7 F (36.5 C)     Temp Source 01/04/21 0834 Oral     SpO2 01/04/21 0834 96 %     Weight 01/04/21 0835 250 lb (113.4 kg)     Height 01/04/21 0835 6\' 1"  (1.854 m)     Head Circumference --      Peak Flow --      Pain Score 01/04/21 0835 2     Pain Loc --      Pain Edu? --      Excl. in [place]? --     Constitutional: Alert and oriented. Well appearing and in no acute distress. Eyes: Conjunctivae are normal.  Head: Atraumatic. Nose: No congestion/rhinnorhea. Neck: No stridor.   Cardiovascular: Normal rate, regular rhythm. Grossly normal heart sounds.  Good peripheral circulation. Respiratory: Normal respiratory effort.  Lungs CTAB. Musculoskeletal: On examination of the right index finger there is a irregular laceration on the volar aspect, PIP joint area without active bleeding or foreign body.  Patient is able to flex and fully extend his index finger without any difficulty.  Motor sensory function intact.  Capillary refill is less than 3 seconds.  There is a small injury to the nail however nail is intact. Neurologic:  Normal speech and language. No gross focal neurologic deficits are appreciated.  Skin:  Skin is warm, dry.  Laceration as noted above. Psychiatric: Mood and affect are normal. Speech and behavior are normal.  ____________________________________________    LABS (all labs ordered are listed, but only abnormal results are displayed)  Labs Reviewed - No data to display ____________________________________________  RADIOLOGY I, Johnn Hai, personally viewed and evaluated these images (plain radiographs) as part of my medical decision making, as well as reviewing the written report by the radiologist.   Official radiology report(s): DG Finger Index Right  Result Date: 01/04/2021 CLINICAL DATA:  Dog bite EXAM: RIGHT SECOND FINGER 2+V COMPARISON:  None. FINDINGS: Frontal, oblique, and lateral views obtained there is soft tissue swelling in the second digit, primarily in the region of the PIP joint. There is an overlying bandage. No other radiopaque foreign body. No soft tissue air appreciable. No fracture  or dislocation. Osteoarthritic changes noted in the second MCP, PIP, and DIP joints. No erosive change. IMPRESSION: Soft tissue swelling with overlying bandage. No other radiopaque foreign body beyond the bandage. No soft tissue air. No fracture or dislocation. Osteoarthritic change noted in second MCP, PIP, and DIP joints. Electronically Signed   By: Lowella Grip III M.D.   On: 01/04/2021 10:21    ____________________________________________   PROCEDURES  Procedure(s) performed (including Critical Care):  Marland KitchenMarland KitchenLaceration Repair  Date/Time: 01/04/2021 11:02 AM Performed by: Johnn Hai, PA-C Authorized by: Johnn Hai, PA-C   Consent:    Consent obtained:  Verbal   Consent given by:  Patient   Risks discussed:  Infection, pain and poor wound healing Universal protocol:    Patient identity confirmed:  Verbally with patient Anesthesia:    Anesthesia method:  Nerve block   Block location:  Right index finger   Block needle gauge:  25 G   Block anesthetic:  Lidocaine 1% w/o epi   Block injection procedure:  Anatomic landmarks identified, introduced needle and incremental injection   Block outcome:  Anesthesia  achieved Laceration details:    Location:  Finger   Finger location:  R index finger   Length (cm):  2.5 Pre-procedure details:    Preparation:  Patient was prepped and draped in usual sterile fashion and imaging obtained to evaluate for foreign bodies Exploration:    Hemostasis achieved with:  Direct pressure   Imaging obtained: x-ray     Imaging outcome: foreign body not noted     Wound exploration: wound explored through full range of motion     Contaminated: no   Treatment:    Area cleansed with:  Saline   Irrigation solution:  Sterile saline   Irrigation volume:  60 ml   Irrigation method:  Syringe   Visualized foreign bodies/material removed: no     Debridement:  None   Undermining:  None Skin repair:    Repair method:  Sutures   Suture size:  4-0   Suture material:  Prolene   Suture technique:  Simple interrupted   Number of sutures:  5 Approximation:    Approximation:  Loose Repair type:    Repair type:  Simple Post-procedure details:    Dressing:  Non-adherent dressing   Procedure completion:  Tolerated   ____________________________________________   INITIAL IMPRESSION / ASSESSMENT AND PLAN / ED COURSE  As part of my medical decision making, I reviewed the following data within the electronic MEDICAL RECORD NUMBER Notes from prior ED visits and [place] Controlled Substance Database  87 year old male presents to the ED after being bitten by his dog this morning.  X-ray was negative for foreign body or bony injury.  Area was cleaned extensively and sutured loosely.  Patient was made aware that he is to watch for any signs of infection and return to the emergency department if any concerns for infection such as pus, fever or redness.  Patient was started on Augmentin 875 twice daily for 10 days.  Areas to be clean daily with mild soap and water and allowed to dry.  He can follow-up with his PCP or urgent care for suture removal in approximately 10  days.  ____________________________________________   FINAL CLINICAL IMPRESSION(S) / ED DIAGNOSES  Final diagnoses:  Dog bite, initial encounter     ED Discharge Orders          Ordered    amoxicillin-clavulanate (AUGMENTIN) 875-125 MG tablet  2 times daily  01/04/21 1128             Note:  This document was prepared using Dragon voice recognition software and may include unintentional dictation errors.    Johnn Hai, PA-C 01/04/21 1624    [place] Dark, MD 01/05/21 [phone]

## 2021-01-04 NOTE — Discharge Instructions (Addendum)
You may follow-up with your primary care provider or urgent care for suture removal in approximately 10 days.  Keep area clean and dry.  You may clean this with mild soap and water and allowed to dry completely before wrapping it.  Area may appear swollen due to trauma.  X-rays were negative for any bony injury or foreign bodies as a result of the dog bite.  Antibiotics were sent to your pharmacy.  Take these antibiotics until completely finished.  Return to the emergency department over the holiday weekend if you develop any fever, redness or pus from the laceration itself.  Elevate your hand to reduce swelling which will also help with pain.  You may take Tylenol or ibuprofen as needed for pain.

## 2021-01-04 NOTE — ED Triage Notes (Addendum)
Pts being bitten by their pt dog this morning. Pt reports injure to index finger. On arrival pt has hand wrapped with towel. Blood noted on towel. Bleedign controled

## 2021-01-12 ENCOUNTER — Ambulatory Visit (INDEPENDENT_AMBULATORY_CARE_PROVIDER_SITE_OTHER): Payer: Medicare Other | Admitting: Family Medicine

## 2021-01-12 ENCOUNTER — Other Ambulatory Visit: Payer: Self-pay

## 2021-01-12 ENCOUNTER — Encounter: Payer: Self-pay | Admitting: Family Medicine

## 2021-01-12 VITALS — BP 137/79 | HR 65 | Ht 76.0 in | Wt 258.0 lb

## 2021-01-12 DIAGNOSIS — W540XXA Bitten by dog, initial encounter: Secondary | ICD-10-CM | POA: Diagnosis not present

## 2021-01-12 DIAGNOSIS — M79644 Pain in right finger(s): Secondary | ICD-10-CM

## 2021-01-12 DIAGNOSIS — S61250A Open bite of right index finger without damage to nail, initial encounter: Secondary | ICD-10-CM

## 2021-01-12 NOTE — Progress Notes (Signed)
Subjective:    Patient ID: Daniel Brandt, male    DOB: Feb 29, 1934, 87 y.o.   MRN: [id number]  Daniel Brandt is a 87 y.o. male presenting on 01/12/2021 for Animal Bite   HPI  R Index Finger Dog Bite / laceration  ED FOLLOW-UP VISIT  Hospital/Location: [facility] Date of ED Visit: 01/04/21  Reason for Presenting to ED: dog bite laceration  FOLLOW-UP  - ED provider note and record have been reviewed - Patient presents today about 9 days after recent ED visit. Brief summary of recent course, patient had symptoms of R finger pain laceration and bleeding, presented to ED on 6/29, reviewed report, he had oral antibiotic and sutures x 5 - Today reports overall has done well after discharge from ED. Symptoms of pain have improved, bleeding improved, dressing changes daily  Here for suture removal    Depression screen [facility] 2/9 01/12/2021 11/29/2020 04/19/2020  Decreased Interest 0 0 0  Down, Depressed, Hopeless 0 0 0  PHQ - 2 Score 0 0 0  Altered sleeping 0 - -  Tired, decreased energy 0 - -  Change in appetite 0 - -  Feeling bad or failure about yourself  0 - -  Trouble concentrating 0 - -  Moving slowly or fidgety/restless 0 - -  Suicidal thoughts 0 - -  PHQ-9 Score 0 - -  Difficult doing work/chores Not difficult at all - -    Social History   Tobacco Use   Smoking status: Former    Packs/day: 1.00    Years: 0.50    Pack years: 0.50    Types: Cigarettes    Quit date: 10/04/1956    Years since quitting: 64.3   Smokeless tobacco: Former   Tobacco comments:    smoking cessation materials not required  Vaping Use   Vaping Use: Never used  Substance Use Topics   Alcohol use: Yes    Alcohol/week: 28.0 standard drinks    Types: 28 Shots of liquor per week    Comment: Scotch   Drug use: No    Review of Systems Per HPI unless specifically indicated above     Objective:    BP 137/79   Pulse 65   Ht 6\' 4"  (1.93 m)   Wt 258 lb (117 kg)   SpO2 99%   BMI 31.40 kg/m    Wt Readings from Last 3 Encounters:  01/12/21 258 lb (117 kg)  01/04/21 250 lb (113.4 kg)  11/29/20 250 lb 9.6 oz (113.7 kg)    Physical Exam Vitals and nursing note reviewed.  Constitutional:      General: He is not in acute distress.    Appearance: He is well-developed. He is not diaphoretic.  Skin:    General: Skin is warm and dry.     Comments: R Index Finger laceration palmar aspect and fingernail punctured Has sutures in place  Neurological:     Mental Status: He is alert.   Results for orders placed or performed in visit on 08/10/20  Hepatic function panel  Result Value Ref Range   Total Protein 6.5 6.0 - 8.5 g/dL   Albumin 4.3 3.6 - 4.6 g/dL   Bilirubin Total 0.3 0.0 - 1.2 mg/dL   Bilirubin, Direct 0.13 0.00 - 0.40 mg/dL   Alkaline Phosphatase 61 44 - 121 IU/L   AST 19 0 - 40 IU/L   ALT 18 0 - 44 IU/L    ________________________________________________________ PROCEDURE NOTE Date - 01/12/21 Suture Removal -  R Index finger location - Location / Date Sutures were placed: 01/04/21 [facility] ED - # of Sutures: 5 Discussed benefits and risks (including pain, bleeding, infection, wound separation). Verbal consent given by patient Medication:  None  Time Out taken Examination of the sutured laceration on R Index Finger appears to be healing. Appropriate # of sutures counted and confirmed. Removal of sutures one by one using sterile pick ups to adjust and sterile scissors to cut one side of suture away from knot. Removal was uncomplicated. - All sutures were successfully removed and wound remains intact - 5 out of 5 sutures were successfully removed     Assessment & Plan:   Problem List Items Addressed This Visit   None Visit Diagnoses     Pain in finger of right hand    -  Primary   Open wound of right index finger due to dog bite           R Index finger S/p dog bite 9 days ago Appropriate healing ED visit 6/29 with laceration, puncture from own pet dog bite,  reviewed record He had suture x 5 and oral antibiotic prophylaxis  Today no sign of secondary infection Removal x 5 sutures without difficulty, no wound separation, mild oozing Dressing reapplied, can change daily, keep open, rinse but not submerge, topical antibiotic ointment, finish oral antibiotic. Return criteria given  No orders of the defined types were placed in this encounter.     Follow up plan: Return if symptoms worsen or fail to improve.   Daniel Brandt, [facility] 01/12/2021, 11:38 AM

## 2021-01-12 NOTE — Patient Instructions (Addendum)
Thank you for coming to the office today.  Wound Care Instructions - Today: Leave it for 24 hours and avoid submerging under water. You may shower but avoid scrubbing the area today. - After 24 hours you can cleanse it gently and use soap and water, and then use topical antibiotic ointment (Neosporin or other) or may use Vaseline - to help protect the skin sore and keep it covered, may take 2-4 weeks for the wound to heal.  If not improving you may need to return for re-evaluation. But if more severe worsening such as spreading redness or streaking redness, significantly larger size, persistent drainage of pus, increased pain, fevers/chills, nausea vomiting. If significantly worse symptoms or most of these symptoms, would recommend going straight to Hospital Emergency Dept otherwise if more mild or new problem and catching it early you may notify our office first and we can consider oral antibiotics.    Please schedule a Follow-up Appointment to: Return if symptoms worsen or fail to improve.  If you have any other questions or concerns, please feel free to call the office or send a message through [place]. You may also schedule an earlier appointment if necessary.  Additionally, you may be receiving a survey about your experience at our office within a few days to 1 week by e-mail or mail. We value your feedback.  Nobie Putnam, DO [place]

## 2021-02-01 DIAGNOSIS — M9903 Segmental and somatic dysfunction of lumbar region: Secondary | ICD-10-CM | POA: Diagnosis not present

## 2021-02-01 DIAGNOSIS — M9905 Segmental and somatic dysfunction of pelvic region: Secondary | ICD-10-CM | POA: Diagnosis not present

## 2021-02-01 DIAGNOSIS — M9904 Segmental and somatic dysfunction of sacral region: Secondary | ICD-10-CM | POA: Diagnosis not present

## 2021-02-01 DIAGNOSIS — M9901 Segmental and somatic dysfunction of cervical region: Secondary | ICD-10-CM | POA: Diagnosis not present

## 2021-02-06 DIAGNOSIS — M9901 Segmental and somatic dysfunction of cervical region: Secondary | ICD-10-CM | POA: Diagnosis not present

## 2021-02-06 DIAGNOSIS — M9904 Segmental and somatic dysfunction of sacral region: Secondary | ICD-10-CM | POA: Diagnosis not present

## 2021-02-06 DIAGNOSIS — M9905 Segmental and somatic dysfunction of pelvic region: Secondary | ICD-10-CM | POA: Diagnosis not present

## 2021-02-06 DIAGNOSIS — M9903 Segmental and somatic dysfunction of lumbar region: Secondary | ICD-10-CM | POA: Diagnosis not present

## 2021-02-10 ENCOUNTER — Other Ambulatory Visit: Payer: Self-pay

## 2021-02-10 DIAGNOSIS — I129 Hypertensive chronic kidney disease with stage 1 through stage 4 chronic kidney disease, or unspecified chronic kidney disease: Secondary | ICD-10-CM

## 2021-02-10 DIAGNOSIS — N183 Chronic kidney disease, stage 3 unspecified: Secondary | ICD-10-CM

## 2021-02-10 DIAGNOSIS — I482 Chronic atrial fibrillation, unspecified: Secondary | ICD-10-CM

## 2021-02-10 MED ORDER — METOPROLOL SUCCINATE ER 100 MG PO TB24: ORAL_TABLET | ORAL | 1 refills | Status: DC

## 2021-02-20 DIAGNOSIS — M9905 Segmental and somatic dysfunction of pelvic region: Secondary | ICD-10-CM | POA: Diagnosis not present

## 2021-02-20 DIAGNOSIS — M9903 Segmental and somatic dysfunction of lumbar region: Secondary | ICD-10-CM | POA: Diagnosis not present

## 2021-02-20 DIAGNOSIS — M9904 Segmental and somatic dysfunction of sacral region: Secondary | ICD-10-CM | POA: Diagnosis not present

## 2021-02-20 DIAGNOSIS — M9901 Segmental and somatic dysfunction of cervical region: Secondary | ICD-10-CM | POA: Diagnosis not present

## 2021-03-22 ENCOUNTER — Other Ambulatory Visit: Payer: Self-pay | Admitting: Family Medicine

## 2021-03-22 DIAGNOSIS — I10 Essential (primary) hypertension: Secondary | ICD-10-CM

## 2021-03-22 NOTE — Telephone Encounter (Signed)
Requested medications are due for refill today.  yes  Requested medications are on the active medications list.  yes  Last refill. 03/01/2020  Future visit scheduled.   yes  Notes to clinic.  Pt last seen for hypertension 04/19/2020. Labs are expired.

## 2021-04-06 DIAGNOSIS — L400 Psoriasis vulgaris: Secondary | ICD-10-CM | POA: Diagnosis not present

## 2021-04-06 DIAGNOSIS — I8312 Varicose veins of left lower extremity with inflammation: Secondary | ICD-10-CM | POA: Diagnosis not present

## 2021-04-06 DIAGNOSIS — I8311 Varicose veins of right lower extremity with inflammation: Secondary | ICD-10-CM | POA: Diagnosis not present

## 2021-08-09 ENCOUNTER — Encounter: Payer: Self-pay | Admitting: Internal Medicine

## 2021-08-09 ENCOUNTER — Other Ambulatory Visit: Payer: Self-pay

## 2021-08-09 ENCOUNTER — Ambulatory Visit (INDEPENDENT_AMBULATORY_CARE_PROVIDER_SITE_OTHER): Payer: Medicare Other | Admitting: Internal Medicine

## 2021-08-09 DIAGNOSIS — I129 Hypertensive chronic kidney disease with stage 1 through stage 4 chronic kidney disease, or unspecified chronic kidney disease: Secondary | ICD-10-CM

## 2021-08-09 DIAGNOSIS — M48061 Spinal stenosis, lumbar region without neurogenic claudication: Secondary | ICD-10-CM

## 2021-08-09 DIAGNOSIS — G609 Hereditary and idiopathic neuropathy, unspecified: Secondary | ICD-10-CM | POA: Diagnosis not present

## 2021-08-09 DIAGNOSIS — N183 Chronic kidney disease, stage 3 unspecified: Secondary | ICD-10-CM

## 2021-08-09 DIAGNOSIS — I482 Chronic atrial fibrillation, unspecified: Secondary | ICD-10-CM | POA: Diagnosis not present

## 2021-08-09 DIAGNOSIS — M170 Bilateral primary osteoarthritis of knee: Secondary | ICD-10-CM

## 2021-08-09 DIAGNOSIS — G4733 Obstructive sleep apnea (adult) (pediatric): Secondary | ICD-10-CM

## 2021-08-09 DIAGNOSIS — Z9989 Dependence on other enabling machines and devices: Secondary | ICD-10-CM | POA: Diagnosis not present

## 2021-08-09 DIAGNOSIS — Z227 Latent tuberculosis: Secondary | ICD-10-CM

## 2021-08-09 NOTE — Assessment & Plan Note (Signed)
Patient is using CPAP

## 2021-08-09 NOTE — Assessment & Plan Note (Signed)
Stable at the present time.  Patient was advised to lose weight

## 2021-08-09 NOTE — Assessment & Plan Note (Signed)
Stable at the present time. 

## 2021-08-09 NOTE — Assessment & Plan Note (Signed)
Patient has been treated for tb

## 2021-08-09 NOTE — Assessment & Plan Note (Signed)
Patient does not want to go on Coumadin or any kind of blood thinner

## 2021-08-09 NOTE — Progress Notes (Addendum)
New Patient Office Visit  Subjective:  Patient ID: Daniel Brandt, male    DOB: [birth year]  Age: 88 y.o. MRN: [id number]  CC:  Chief Complaint  Patient presents with   New Patient (Initial Visit)    HPI Patient presents for new pt  Past Medical History:  Diagnosis Date   A-fib [facility]    Arthritis    FH: heart failure 10/04/2016   Former smoker    Heart murmur    Hypertension    Malaria    Stroke ([place])    TB lung, latent    Completed 4 months of LTBI tx with Rifampin ~09/09/2020     Current Outpatient Medications:    aspirin 81 MG tablet, Take 1 tablet (81 mg total) by mouth daily., Disp: 30 tablet, Rfl:    ivermectin (STROMECTOL) 3 MG TABS tablet, Take by mouth once. Patient reports taking 12 mg daily, Disp: , Rfl:    metoprolol succinate (TOPROL-XL) 100 MG 24 hr tablet, TAKE 1 TABLET(100 MG) BY MOUTH DAILY WITH OR IMMEDIATELY FOLLOWING A MEAL, Disp: 90 tablet, Rfl: 1   Multiple Vitamins-Minerals (MULTIVITAMIN WITH MINERALS) tablet, Take 1 tablet by mouth daily., Disp: , Rfl:    NON FORMULARY, CPAP nightly, Disp: , Rfl:    olmesartan (BENICAR) 40 MG tablet, TAKE 1 TABLET(40 MG) BY MOUTH DAILY, Disp: 90 tablet, Rfl: 3   Omega-3 Fatty Acids (FISH OIL) 1000 MG CAPS, Take 2 capsules by mouth 2 (two) times daily., Disp: , Rfl:    vitamin B-12 (CYANOCOBALAMIN) 500 MCG tablet, Take 500 mcg by mouth daily., Disp: , Rfl:    Past Surgical History:  Procedure Laterality Date   COLONOSCOPY  06/12/2016   FRACTURE SURGERY     KNEE REPLACE Bilateral 2017   LUMBAR FUSION  1995   L3 L4    Family History  Problem Relation Age of Onset   Heart disease Mother    Heart disease Father    Gout Sister    Heart attack Son     Social History   Socioeconomic History   Marital status: Married    Spouse name: Not on file   Number of children: 6   Years of education: Not on file   Highest education level: Associate degree: academic program  Occupational History   Occupation: Retired   Tobacco Use   Smoking status: Former    Packs/day: 1.00    Years: 0.50    Pack years: 0.50    Types: Cigarettes    Quit date: 10/04/1956    Years since quitting: 64.8   Smokeless tobacco: Former   Tobacco comments:    smoking cessation materials not required  Vaping Use   Vaping Use: Never used  Substance and Sexual Activity   Alcohol use: Yes    Alcohol/week: 28.0 standard drinks    Types: 28 Shots of liquor per week    Comment: Scotch   Drug use: No   Sexual activity: Not Currently  Other Topics Concern   Not on file  Social History Narrative   Not on file   Social Determinants of Health   Financial Resource Strain: Low Risk    Difficulty of Paying Living Expenses: Not hard at all  Food Insecurity: No Food Insecurity   Worried About Charity fundraiser in the Last Year: Never true   [place] in the Last Year: Never true  Transportation Needs: No Transportation Needs   Lack of Transportation (Medical): No  Lack of Transportation (Non-Medical): No  Physical Activity: Inactive   Days of Exercise per Week: 0 days   Minutes of Exercise per Session: 0 min  Stress: No Stress Concern Present   Feeling of Stress : Not at all  Social Connections: Not on file  Intimate Partner Violence: Not on file    ROS Review of Systems  Constitutional: Negative.   HENT: Negative.    Eyes: Negative.   Respiratory: Negative.    Cardiovascular: Negative.   Gastrointestinal: Negative.   Endocrine: Negative.   Genitourinary: Negative.   Musculoskeletal: Negative.   Skin: Negative.   Allergic/Immunologic: Negative.   Neurological: Negative.   Hematological: Negative.   Psychiatric/Behavioral: Negative.    All other systems reviewed and are negative.  Objective:   Today's Vitals: There were no vitals taken for this visit.  Physical Exam Constitutional:      Appearance: He is obese.  HENT:     Head: Normocephalic.     Mouth/Throat:     Mouth: Mucous membranes are  moist.  Cardiovascular:     Rate and Rhythm: Rhythm irregular.  Pulmonary:     Effort: Pulmonary effort is normal.  Musculoskeletal:        General: Normal range of motion.     Cervical back: Normal range of motion.     Comments: Swelling of both leg / psoriasis  Neurological:     General: No focal deficit present.     Mental Status: He is alert.  Psychiatric:        Mood and Affect: Mood normal.    Assessment & Plan:   Problem List Items Addressed This Visit       Cardiovascular and Mediastinum   Benign hypertension with CKD (chronic kidney disease) stage III ([place]) - Primary     Patient denies any chest pain or shortness of breath there is no history of palpitation or paroxysmal nocturnal dyspnea   patient was advised to follow low-salt low-cholesterol diet    ideally I want to keep systolic blood pressure below 130 mmHg, patient was asked to check blood pressure one times a week and give me a report on that.  Patient will be follow-up in 3 months  or earlier as needed, patient will call me back for any change in the cardiovascular symptoms Patient was advised to buy a book from local bookstore concerning blood pressure and read several chapters  every day.  This will be supplemented by some of the material we will give him from the office.  Patient should also utilize other resources like YouTube and Internet to learn more about the blood pressure and the diet.      Chronic atrial fibrillation    Patient does not want to go on Coumadin or any kind of blood thinner      Relevant Orders   EKG 12-Lead     Respiratory   OSA on CPAP    Patient is using CPAP      TB lung, latent    Patient has been treated for tb          Nervous and Auditory   Peripheral neuropathy, idiopathic     Musculoskeletal and Integument   Osteoarthritis of both knees    Stable at the present time.  Patient was advised to lose weight        Other   Spinal stenosis of lumbar region  without neurogenic claudication    Stable at the present time  I advised the patient to lose weight.,  I offered him to stop drinking,  I asked him to stop smoking.  Report of the electrocardiogram. Atrial fibrillation Nonspecific ST-T abnormalities No acute changes are noted  Outpatient Encounter Medications as of 08/09/2021  Medication Sig   aspirin 81 MG tablet Take 1 tablet (81 mg total) by mouth daily.   ivermectin (STROMECTOL) 3 MG TABS tablet Take by mouth once. Patient reports taking 12 mg daily   metoprolol succinate (TOPROL-XL) 100 MG 24 hr tablet TAKE 1 TABLET(100 MG) BY MOUTH DAILY WITH OR IMMEDIATELY FOLLOWING A MEAL   Multiple Vitamins-Minerals (MULTIVITAMIN WITH MINERALS) tablet Take 1 tablet by mouth daily.   NON FORMULARY CPAP nightly   olmesartan (BENICAR) 40 MG tablet TAKE 1 TABLET(40 MG) BY MOUTH DAILY   Omega-3 Fatty Acids (FISH OIL) 1000 MG CAPS Take 2 capsules by mouth 2 (two) times daily.   vitamin B-12 (CYANOCOBALAMIN) 500 MCG tablet Take 500 mcg by mouth daily.   No facility-administered encounter medications on file as of 08/09/2021.    Follow-up: No follow-ups on file.   Cletis Athens, MD

## 2021-08-09 NOTE — Assessment & Plan Note (Signed)

## 2021-08-19 ENCOUNTER — Other Ambulatory Visit: Payer: Self-pay | Admitting: Family Medicine

## 2021-08-19 DIAGNOSIS — N183 Chronic kidney disease, stage 3 unspecified: Secondary | ICD-10-CM

## 2021-08-19 DIAGNOSIS — I482 Chronic atrial fibrillation, unspecified: Secondary | ICD-10-CM

## 2021-08-19 DIAGNOSIS — I129 Hypertensive chronic kidney disease with stage 1 through stage 4 chronic kidney disease, or unspecified chronic kidney disease: Secondary | ICD-10-CM

## 2021-08-19 NOTE — Telephone Encounter (Signed)
Requested medications are due for refill today.  yes  Requested medications are on the active medications list.  yes  Last refill. 02/10/2021 #90 1 refill  Future visit scheduled.   no  Notes to clinic.  Pcp listed is Dr. Lavera Guise.    Requested Prescriptions  Pending Prescriptions Disp Refills   metoprolol succinate (TOPROL-XL) 100 MG 24 hr tablet [Pharmacy Med Name: METOPROLOL ER SUCCINATE 100MG  TABS] 90 tablet 1    Sig: TAKE 1 TABLET(100 MG) BY MOUTH DAILY WITH OR IMMEDIATELY FOLLOWING A MEAL     Cardiovascular:  Beta Blockers Failed - 08/19/2021  3:18 AM      Failed - Valid encounter within last 6 months    Recent Outpatient Visits           7 months ago Pain in finger of right hand   [facility] Olin Hauser, DO   1 year ago Annual physical exam   [facility] Olin Hauser, DO   1 year ago Dermatomyositis [facility])   [facility] Olin Hauser, DO   1 year ago Benign hypertension with CKD (chronic kidney disease) stage III   [place], DO   2 years ago Annual physical exam   [facility] Glean Hess, MD       Future Appointments             In 2 months Cletis Athens, MD [place] BP in normal range    BP Readings from Last 1 Encounters:  01/12/21 137/79          Passed - Last Heart Rate in normal range    Pulse Readings from Last 1 Encounters:  01/12/21 65

## 2021-09-12 ENCOUNTER — Encounter: Payer: Self-pay | Admitting: Family Medicine

## 2021-09-12 ENCOUNTER — Other Ambulatory Visit: Payer: Self-pay

## 2021-09-12 DIAGNOSIS — I129 Hypertensive chronic kidney disease with stage 1 through stage 4 chronic kidney disease, or unspecified chronic kidney disease: Secondary | ICD-10-CM

## 2021-09-12 DIAGNOSIS — I482 Chronic atrial fibrillation, unspecified: Secondary | ICD-10-CM

## 2021-09-12 MED ORDER — METOPROLOL SUCCINATE ER 100 MG PO TB24: ORAL_TABLET | ORAL | 0 refills | Status: DC

## 2021-10-13 ENCOUNTER — Ambulatory Visit (INDEPENDENT_AMBULATORY_CARE_PROVIDER_SITE_OTHER): Payer: Medicare Other | Admitting: Family Medicine

## 2021-10-13 ENCOUNTER — Encounter: Payer: Self-pay | Admitting: Family Medicine

## 2021-10-13 VITALS — BP 128/76 | HR 61 | Ht 76.0 in | Wt 261.2 lb

## 2021-10-13 DIAGNOSIS — L4050 Arthropathic psoriasis, unspecified: Secondary | ICD-10-CM | POA: Diagnosis not present

## 2021-10-13 DIAGNOSIS — M170 Bilateral primary osteoarthritis of knee: Secondary | ICD-10-CM | POA: Diagnosis not present

## 2021-10-13 DIAGNOSIS — L409 Psoriasis, unspecified: Secondary | ICD-10-CM | POA: Diagnosis not present

## 2021-10-13 DIAGNOSIS — N183 Chronic kidney disease, stage 3 unspecified: Secondary | ICD-10-CM | POA: Diagnosis not present

## 2021-10-13 DIAGNOSIS — L989 Disorder of the skin and subcutaneous tissue, unspecified: Secondary | ICD-10-CM | POA: Diagnosis not present

## 2021-10-13 DIAGNOSIS — I482 Chronic atrial fibrillation, unspecified: Secondary | ICD-10-CM | POA: Diagnosis not present

## 2021-10-13 DIAGNOSIS — I129 Hypertensive chronic kidney disease with stage 1 through stage 4 chronic kidney disease, or unspecified chronic kidney disease: Secondary | ICD-10-CM

## 2021-10-13 MED ORDER — METOPROLOL SUCCINATE ER 50 MG PO TB24: 50.0000 mg | ORAL_TABLET | Freq: Every day | ORAL | 3 refills | Status: DC

## 2021-10-13 MED ORDER — TRIAMCINOLONE ACETONIDE 0.1 % EX CREA: TOPICAL_CREAM | Freq: Two times a day (BID) | CUTANEOUS | 2 refills | Status: AC | PRN

## 2021-10-13 NOTE — Progress Notes (Signed)
? ?Subjective:  ? ? Patient ID: Daniel Brandt, male    DOB: 1933-11-24, 88 y.o.   MRN: [id number] ? ?Daniel Brandt is a 88 y.o. male presenting on 10/13/2021 for Abrasion and Rash ? ? ?HPI ? ?Psoriatic Arthritis ?Followed by Dr Salvatore Marvel Posey [place] [facility] ?- He had been given diagnosis of Psoriatic Arthritis, instead of dermatomyositis, given rx of Enbrel but patient declined to start it. He was referred to Dermatology, and they have prescribed ointment for hands. He will need new order of cream.  Has not been back to Derm. Request a refill of refill Tac 0.1% / Cerave Cream - Apply to body daily, 454 ?Previously at [facility] ? ?Non healing skin lesion on tip of nose ?Multiple skin scabs on lower extremity  ?He has not returned to Dermatology recently.  ? ?Chronic Back Pain / Lumbar DDD with spinal stenosis ?History of prior back fracture. 1995 had lumbar back surgery. Had complication 1 year ago, and was offered repeat surgeon  ?- Followed by [facility] Neurosurgery / Pain Anesthesia - followed by Dr Sharlet Salina and Allene Dillon NP, they are doing  ?- He has difficulty with numbness in lower extremity and some weakness, now he says pain in back is more dull and less often. He has improved sensation in Right lower leg now. ?- Prior knee replacement surgery, other injuries. ?  ?Atrial Fibrillation / CHRONIC HTN: ?Reports no new concerns. No episodic AFib problems. ?Followed by Dr Ubaldo Glassing [facility]) recently retired. ?He does report fatigue and decreased energy ?Current Meds - Metoprolol XL '100mg'$ , Olmesartan '40mg'$  daily ?Reports good compliance, took meds today. Tolerating well, w/o complaints. ?Denies CP, dyspnea, HA, edema, dizziness / lightheadedness ? ? ? ?  10/13/2021  ?  8:02 AM 08/09/2021  ? 11:01 AM 01/12/2021  ? 11:11 AM  ?Depression screen PHQ 2/9  ?Decreased Interest 3 0 0  ?Down, Depressed, Hopeless 0 0 0  ?PHQ - 2 Score 3 0 0  ?Altered sleeping 0  0  ?Tired, decreased energy 3  0  ?Change in  appetite 0  0  ?Feeling bad or failure about yourself  0  0  ?Trouble concentrating 0  0  ?Moving slowly or fidgety/restless 0  0  ?Suicidal thoughts 0  0  ?PHQ-9 Score 6  0  ?Difficult doing work/chores Extremely dIfficult  Not difficult at all  ? ? ?Social History  ? ?Tobacco Use  ? Smoking status: Former  ?  Packs/day: 1.00  ?  Years: 0.50  ?  Pack years: 0.50  ?  Types: Cigarettes  ?  Quit date: 10/04/1956  ?  Years since quitting: 65.0  ? Smokeless tobacco: Former  ? Tobacco comments:  ?  smoking cessation materials not required  ?Vaping Use  ? Vaping Use: Never used  ?Substance Use Topics  ? Alcohol use: Yes  ?  Alcohol/week: 28.0 standard drinks  ?  Types: 28 Shots of liquor per week  ?  Comment: Scotch  ? Drug use: No  ? ? ?Review of Systems ?Per HPI unless specifically indicated above ? ?   ?Objective:  ?  ?BP 128/76   Pulse 61   Ht '6\' 4"'$  (1.93 m)   Wt 261 lb 3.2 oz (118.5 kg)   SpO2 99%   BMI 31.79 kg/m?   ?Wt Readings from Last 3 Encounters:  ?10/13/21 261 lb 3.2 oz (118.5 kg)  ?01/12/21 258 lb (117 kg)  ?01/04/21 250 lb (113.4 kg)  ?  ?Physical  Exam ?Vitals and nursing note reviewed.  ?Constitutional:   ?   General: He is not in acute distress. ?   Appearance: Normal appearance. He is well-developed. He is not diaphoretic.  ?   Comments: Well-appearing, comfortable, cooperative  ?HENT:  ?   Head: Normocephalic and atraumatic.  ?Eyes:  ?   General:     ?   Right eye: No discharge.     ?   Left eye: No discharge.  ?   Conjunctiva/sclera: Conjunctivae normal.  ?Cardiovascular:  ?   Rate and Rhythm: Normal rate.  ?Pulmonary:  ?   Effort: Pulmonary effort is normal.  ?Skin: ?   General: Skin is warm and dry.  ?   Findings: Lesion (non healing 1 cm lesion on tip of nose.) and rash (multiple thickened skin rash areas on fingers with psoriasis) present. No erythema.  ?Neurological:  ?   Mental Status: He is alert and oriented to person, place, and time.  ?Psychiatric:     ?   Mood and Affect: Mood normal.      ?   Behavior: Behavior normal.     ?   Thought Content: Thought content normal.  ?   Comments: Well groomed, good eye contact, normal speech and thoughts  ? ? ?Results for orders placed or performed in visit on 08/10/20  ?Hepatic function panel  ?Result Value Ref Range  ? Total Protein 6.5 6.0 - 8.5 g/dL  ? Albumin 4.3 3.6 - 4.6 g/dL  ? Bilirubin Total 0.3 0.0 - 1.2 mg/dL  ? Bilirubin, Direct 0.13 0.00 - 0.40 mg/dL  ? Alkaline Phosphatase 61 44 - 121 IU/L  ? AST 19 0 - 40 IU/L  ? ALT 18 0 - 44 IU/L  ? ?   ?Assessment & Plan:  ? ?Problem List Items Addressed This Visit   ? ? Psoriatic arthritis ([place]) - Primary  ? Relevant Medications  ? triamcinolone 0.1%-Cerave equivalent 1:1 cream mixture  ? DJD (degenerative joint disease)  ? Chronic atrial fibrillation  ? Relevant Medications  ? metoprolol succinate (TOPROL-XL) 50 MG 24 hr tablet  ? Benign hypertension with CKD (chronic kidney disease) stage III ([facility])  ? Relevant Medications  ? metoprolol succinate (TOPROL-XL) 50 MG 24 hr tablet  ? ?Other Visit Diagnoses   ? ? Non-healing skin lesion of nose      ? Psoriasis      ? Relevant Medications  ? triamcinolone 0.1%-Cerave equivalent 1:1 cream mixture  ? ?  ?  ?Likely the reduced energy and fatigue from beta blocker high dose. His BP has improved overall now. ? ?Additionally known AFib may warrant further investigation, last visit with Cards 2+ years. ? ?Reduce Metoprolol XL '100mg'$  daily down to '50mg'$  - new rx ordered. ? ?Please return to Cardiologist for re-evaluation. However I do believe Dr Ubaldo Glassing is retiring, so you would likely need to meet a new provider. ? ?[facility] ?[address] ?[place], [place]  [address] ?Phone: [phone] ? ?--------------------------- ? ?Phoned in refills on Triamcinolone 0.1%/Cerave cream mix, he already had refills available. ? ?Advised to return to Dermatology for non healing skin lesion on nose. ? ?[facility] ?Dermatologist in [place], [place] ?Address: [address], [place], [place] [address] ?Phone: [phone] ? ?Meds ordered this encounter  ?Medications  ? metoprolol succinate (TOPROL-XL) 50 MG 24 hr tablet  ?  Sig: Take 1 tablet (50 mg total) by mouth daily. Take with or immediately following a meal.  ?  Dispense:  90 tablet  ?  Refill:  3  ?  Dose reduced from 100 to '50mg'$   ? triamcinolone 0.1%-Cerave equivalent 1:1 cream mixture  ?  Sig: Apply topically 2 (two) times daily as needed.  ?  Dispense:  454 g  ?  Refill:  2  ? ? ? ? ?Follow up plan: ?Return in about 6 months (around 04/14/2022) for 6 month follow-up Annual Physical with labs. ? ?Nobie Putnam, DO ?[facility] ?[facility] ?10/13/2021, 8:13 AM ?

## 2021-10-13 NOTE — Patient Instructions (Addendum)
Thank you for coming to the office today. ? ?Likely the reduced energy can be due to too low heart rate from medication ? ?Reduce Metoprolol XL '100mg'$  daily down to '50mg'$  - new rx ordered. ? ?Please return to Cardiologist for re-evaluation. However I do believe Dr Ubaldo Glassing is retiring, so you would likely need to meet a new provider. ? ?[facility] ?[address] ?[place], [place]  [address] ?Phone: [phone] ? ?--------------------------- ? ?I will try to order the topical cream as you requested but it is a specialty order, if I cannot then the Dermatologist will have to re order. ? ?Please return to Dermatologist to have the skin spot on the nose looked at. I am worried about an early skin cancer since it is not healing. It would require a biopsy. ? ?[facility] ?Dermatologist in [place], [place] ?Address: [address], [place], [place] [address] ?Phone: [phone] ? ?DUE for FASTING BLOOD WORK (no food or drink after midnight before the lab appointment, only water or coffee without cream/sugar on the morning of) ? ?SCHEDULE "Lab Only" visit in the morning at the clinic for lab draw in 6 MONTHS  ? ?- Make sure Lab Only appointment is at about 1 week before your next appointment, so that results will be available ? ?For Lab Results, once available within 2-3 days of blood draw, you can can log in to MyChart online to view your results and a brief explanation. Also, we can discuss results at next follow-up visit. ? ? ?Please schedule a Follow-up Appointment to: Return in about 6 months (around 04/14/2022) for 6 month follow-up Annual Physical with labs. ? ?If you have any other questions or concerns, please feel free to call the office or send a message through [place]. You may also schedule an earlier appointment if necessary. ? ?Additionally, you may be receiving a survey about your experience at our office within a few days to 1 week by e-mail or mail. We value your feedback. ? ?Nobie Putnam, DO ?[facility] ?

## 2021-10-14 ENCOUNTER — Encounter: Payer: Self-pay | Admitting: Family Medicine

## 2021-10-18 DIAGNOSIS — B078 Other viral warts: Secondary | ICD-10-CM | POA: Diagnosis not present

## 2021-10-18 DIAGNOSIS — D485 Neoplasm of uncertain behavior of skin: Secondary | ICD-10-CM | POA: Diagnosis not present

## 2021-10-23 ENCOUNTER — Ambulatory Visit (INDEPENDENT_AMBULATORY_CARE_PROVIDER_SITE_OTHER): Payer: Medicare Other | Admitting: Internal Medicine

## 2021-10-23 ENCOUNTER — Encounter: Payer: Self-pay | Admitting: Internal Medicine

## 2021-10-23 VITALS — BP 147/80 | HR 73 | Ht 76.0 in | Wt 261.2 lb

## 2021-10-23 DIAGNOSIS — G4733 Obstructive sleep apnea (adult) (pediatric): Secondary | ICD-10-CM

## 2021-10-23 DIAGNOSIS — G609 Hereditary and idiopathic neuropathy, unspecified: Secondary | ICD-10-CM | POA: Diagnosis not present

## 2021-10-23 DIAGNOSIS — N183 Chronic kidney disease, stage 3 unspecified: Secondary | ICD-10-CM

## 2021-10-23 DIAGNOSIS — Z9989 Dependence on other enabling machines and devices: Secondary | ICD-10-CM | POA: Diagnosis not present

## 2021-10-23 DIAGNOSIS — I482 Chronic atrial fibrillation, unspecified: Secondary | ICD-10-CM | POA: Diagnosis not present

## 2021-10-23 DIAGNOSIS — I129 Hypertensive chronic kidney disease with stage 1 through stage 4 chronic kidney disease, or unspecified chronic kidney disease: Secondary | ICD-10-CM | POA: Diagnosis not present

## 2021-10-23 NOTE — Assessment & Plan Note (Signed)
Patient is using CPAP machine 

## 2021-10-23 NOTE — Progress Notes (Signed)
? ?Established Patient Office Visit ? ?Subjective:  ?Patient ID: Daniel Brandt, male    DOB: 12-01-1933  Age: 88 y.o. MRN: [id number] ? ?CC:  ?Chief Complaint  ?Patient presents with  ? Hypertension  ? ? ?Hypertension ?Associated symptoms include palpitations. Pertinent negatives include no chest pain or headaches.  ? ?Daniel Brandt presents for check up ? ?Past Medical History:  ?Diagnosis Date  ? A-fib ([place])   ? Arthritis   ? FH: heart failure 10/04/2016  ? Former smoker   ? Heart murmur   ? Hypertension   ? Malaria   ? Stroke [facility])   ? TB lung, latent   ? Completed 4 months of LTBI tx with Rifampin ~09/09/2020  ? ? ?Past Surgical History:  ?Procedure Laterality Date  ? COLONOSCOPY  06/12/2016  ? FRACTURE SURGERY    ? KNEE REPLACE Bilateral 2017  ? [place]  ? L3 L4  ? ? ?Family History  ?Problem Relation Age of Onset  ? Heart disease Mother   ? Heart disease Father   ? Gout Sister   ? Heart attack Son   ? ? ?Social History  ? ?Socioeconomic History  ? Marital status: Married  ?  Spouse name: Not on file  ? Number of children: 6  ? Years of education: Not on file  ? Highest education level: Associate degree: academic program  ?Occupational History  ? Occupation: Retired  ?Tobacco Use  ? Smoking status: Former  ?  Packs/day: 1.00  ?  Years: 0.50  ?  Pack years: 0.50  ?  Types: Cigarettes  ?  Quit date: 10/04/1956  ?  Years since quitting: 65.0  ? Smokeless tobacco: Former  ? Tobacco comments:  ?  smoking cessation materials not required  ?Vaping Use  ? Vaping Use: Never used  ?Substance and Sexual Activity  ? Alcohol use: Yes  ?  Alcohol/week: 28.0 standard drinks  ?  Types: 28 Shots of liquor per week  ?  Comment: Scotch  ? Drug use: No  ? Sexual activity: Not Currently  ?Other Topics Concern  ? Not on file  ?Social History Narrative  ? Not on file  ? ?Social Determinants of Health  ? ?Financial Resource Strain: Low Risk   ? Difficulty of Paying Living Expenses: Not hard at all  ?Food Insecurity: No Food  Insecurity  ? Worried About Charity fundraiser in the Last Year: Never true  ? Ran Out of Food in the Last Year: Never true  ?Transportation Needs: No Transportation Needs  ? Lack of Transportation (Medical): No  ? Lack of Transportation (Non-Medical): No  ?Physical Activity: Inactive  ? Days of Exercise per Week: 0 days  ? Minutes of Exercise per Session: 0 min  ?Stress: No Stress Concern Present  ? Feeling of Stress : Not at all  ?Social Connections: Not on file  ?Intimate Partner Violence: Not on file  ? ? ? ?Current Outpatient Medications:  ?  aspirin 81 MG tablet, Take 1 tablet (81 mg total) by mouth daily., Disp: 30 tablet, Rfl:  ?  metoprolol succinate (TOPROL-XL) 50 MG 24 hr tablet, Take 1 tablet (50 mg total) by mouth daily. Take with or immediately following a meal., Disp: 90 tablet, Rfl: 3 ?  Multiple Vitamins-Minerals (MULTIVITAMIN WITH MINERALS) tablet, Take 1 tablet by mouth daily., Disp: , Rfl:  ?  NON FORMULARY, CPAP nightly, Disp: , Rfl:  ?  olmesartan (BENICAR) 40 MG tablet, TAKE 1 TABLET(40 MG)  BY MOUTH DAILY, Disp: 90 tablet, Rfl: 3 ?  Omega-3 Fatty Acids (FISH OIL) 1000 MG CAPS, Take 2 capsules by mouth 2 (two) times daily., Disp: , Rfl:  ?  triamcinolone 0.1%-Cerave equivalent 1:1 cream mixture, Apply topically 2 (two) times daily as needed., Disp: 454 g, Rfl: 2 ?  vitamin B-12 (CYANOCOBALAMIN) 500 MCG tablet, Take 500 mcg by mouth daily., Disp: , Rfl:   ? ?No Known Allergies ? ?ROS ?Review of Systems  ?Respiratory:  Negative for apnea, cough, chest tightness and wheezing.   ?Cardiovascular:  Positive for palpitations. Negative for chest pain and leg swelling.  ?Gastrointestinal:  Negative for abdominal distention.  ?Genitourinary:  Negative for difficulty urinating.  ?Neurological:  Negative for headaches.  ?Hematological:  Negative for adenopathy.  ? ?  ?Objective:  ?  ?Physical Exam ?Vitals reviewed.  ?Constitutional:   ?   Appearance: Normal appearance.  ?HENT:  ?   Mouth/Throat:  ?    Mouth: Mucous membranes are moist.  ?Eyes:  ?   Pupils: Pupils are equal, round, and reactive to light.  ?Neck:  ?   Vascular: No carotid bruit.  ?Cardiovascular:  ?   Rate and Rhythm: Normal rate and regular rhythm.  ?   Pulses: Normal pulses.  ?   Heart sounds: Normal heart sounds.  ?Pulmonary:  ?   Effort: Pulmonary effort is normal.  ?   Breath sounds: Normal breath sounds.  ?Abdominal:  ?   General: Bowel sounds are normal.  ?   Palpations: Abdomen is soft. There is no hepatomegaly, splenomegaly or mass.  ?   Tenderness: There is no abdominal tenderness.  ?   Hernia: No hernia is present.  ?Musculoskeletal:  ?   Cervical back: Neck supple.  ?   Right lower leg: No edema.  ?   Left lower leg: No edema.  ?Skin: ?   Findings: No rash.  ?Neurological:  ?   Mental Status: He is alert and oriented to person, place, and time.  ?   Motor: No weakness.  ?Psychiatric:     ?   Mood and Affect: Mood normal.     ?   Behavior: Behavior normal.  ? ? ?BP (!) 147/80   Pulse 73   Ht '6\' 4"'$  (1.93 m)   Wt 261 lb 3.2 oz (118.5 kg)   BMI 31.79 kg/m?  ?Wt Readings from Last 3 Encounters:  ?10/23/21 261 lb 3.2 oz (118.5 kg)  ?10/13/21 261 lb 3.2 oz (118.5 kg)  ?01/12/21 258 lb (117 kg)  ? ? ? ?Health Maintenance Due  ?Topic Date Due  ? COVID-19 Vaccine (3 - Mixed Product risk series) 10/04/2019  ? ? ?There are no preventive care reminders to display for this patient. ? ?Lab Results  ?Component Value Date  ? TSH 4.77 (H) 04/12/2020  ? ?Lab Results  ?Component Value Date  ? WBC 6.2 05/09/2020  ? HGB 13.4 05/09/2020  ? HCT 38.8 05/09/2020  ? MCV 104 (H) 05/09/2020  ? PLT 192 05/09/2020  ? ?Lab Results  ?Component Value Date  ? NA 138 04/12/2020  ? K 4.2 04/12/2020  ? CO2 23 04/12/2020  ? GLUCOSE 93 04/12/2020  ? BUN 14 04/12/2020  ? CREATININE 1.20 (H) 04/12/2020  ? BILITOT 0.3 08/10/2020  ? ALKPHOS 61 08/10/2020  ? AST 19 08/10/2020  ? ALT 18 08/10/2020  ? PROT 6.5 08/10/2020  ? ALBUMIN 4.3 08/10/2020  ? CALCIUM 9.0 04/12/2020   ? ?Lab Results  ?Component Value  Date  ? CHOL 152 04/12/2020  ? ?Lab Results  ?Component Value Date  ? HDL 41 04/12/2020  ? ?Lab Results  ?Component Value Date  ? [place] 78 04/12/2020  ? ?Lab Results  ?Component Value Date  ? TRIG 236 (H) 04/12/2020  ? ?Lab Results  ?Component Value Date  ? CHOLHDL 3.7 04/12/2020  ? ?Lab Results  ?Component Value Date  ? HGBA1C 5.3 04/12/2020  ? ? ?  ?Assessment & Plan:  ? ?Problem List Items Addressed This Visit   ? ?  ? Cardiovascular and Mediastinum  ? Benign hypertension with CKD (chronic kidney disease) stage III (HCC) - Primary  ? Chronic atrial fibrillation  ?  Stable at the present time ? ?  ?  ?  ? Respiratory  ? OSA on CPAP  ?  Patient is using CPAP machine ? ?  ?  ?  ? Nervous and Auditory  ? Peripheral neuropathy, idiopathic  ?  Chronic problem.  Patient has surgery for both knee, patient also has a chronic dermatitis of the both legs ? ?  ?  ? ? ?No orders of the defined types were placed in this encounter. ? ? ?Follow-up: No follow-ups on file.  ? ? ?Cletis Athens, MD ?

## 2021-10-23 NOTE — Assessment & Plan Note (Signed)
Stable at the present time. 

## 2021-10-23 NOTE — Assessment & Plan Note (Signed)
Chronic problem.  Patient has surgery for both knee, patient also has a chronic dermatitis of the both legs ?

## 2021-10-30 ENCOUNTER — Ambulatory Visit: Payer: Medicare Other | Admitting: Internal Medicine

## 2021-11-06 ENCOUNTER — Ambulatory Visit: Payer: Medicare Other | Admitting: Internal Medicine

## 2021-12-05 ENCOUNTER — Ambulatory Visit: Payer: Medicare Other

## 2021-12-09 ENCOUNTER — Other Ambulatory Visit: Payer: Self-pay | Admitting: Family Medicine

## 2021-12-09 DIAGNOSIS — I482 Chronic atrial fibrillation, unspecified: Secondary | ICD-10-CM

## 2021-12-09 DIAGNOSIS — I129 Hypertensive chronic kidney disease with stage 1 through stage 4 chronic kidney disease, or unspecified chronic kidney disease: Secondary | ICD-10-CM

## 2021-12-11 NOTE — Telephone Encounter (Signed)
Dose was changed to '50mg'$ . Requested Prescriptions  Refused Prescriptions Disp Refills  . metoprolol succinate (TOPROL-XL) 100 MG 24 hr tablet [Pharmacy Med Name: METOPROLOL ER SUCCINATE '100MG'$  TABS] 90 tablet 0    Sig: TAKE 1 TABLET(100 MG) BY MOUTH DAILY WITH OR IMMEDIATELY FOLLOWING A MEAL     Cardiovascular:  Beta Blockers Failed - 12/09/2021  3:19 AM      Failed - Last BP in normal range    BP Readings from Last 1 Encounters:  10/23/21 (!) 147/80         Passed - Last Heart Rate in normal range    Pulse Readings from Last 1 Encounters:  10/23/21 73         Passed - Valid encounter within last 6 months    Recent Outpatient Visits          1 month ago Psoriatic arthritis [facility])   [place], DO   11 months ago Pain in finger of right hand   [place], DO   1 year ago Annual physical exam   [place], DO   1 year ago Dermatomyositis [facility])   [place], DO   2 years ago Benign hypertension with CKD (chronic kidney disease) stage III   [place], Devonne Doughty, DO      Future Appointments            In 1 month Cletis Athens, MD [facility]   In 1 month Cletis Athens, MD [facility]

## 2022-01-11 ENCOUNTER — Ambulatory Visit (INDEPENDENT_AMBULATORY_CARE_PROVIDER_SITE_OTHER): Payer: Medicare Other

## 2022-01-11 VITALS — BP 116/66 | HR 67 | Temp 97.8°F | Resp 18 | Ht 76.0 in | Wt 263.4 lb

## 2022-01-11 DIAGNOSIS — E781 Pure hyperglyceridemia: Secondary | ICD-10-CM

## 2022-01-11 DIAGNOSIS — Z136 Encounter for screening for cardiovascular disorders: Secondary | ICD-10-CM | POA: Diagnosis not present

## 2022-01-11 DIAGNOSIS — N183 Chronic kidney disease, stage 3 unspecified: Secondary | ICD-10-CM | POA: Diagnosis not present

## 2022-01-11 DIAGNOSIS — I129 Hypertensive chronic kidney disease with stage 1 through stage 4 chronic kidney disease, or unspecified chronic kidney disease: Secondary | ICD-10-CM

## 2022-01-11 DIAGNOSIS — Z0000 Encounter for general adult medical examination without abnormal findings: Secondary | ICD-10-CM | POA: Diagnosis not present

## 2022-01-11 NOTE — Progress Notes (Signed)
Subjective:   Daniel Brandt is a 88 y.o. male who presents for Medicare Annual/Subsequent preventive examination.  Review of Systems   Per HPI unless specifically indicated above        Objective:       01/11/2022    9:54 AM 10/23/2021   12:23 PM 10/13/2021    7:59 AM  Vitals with BMI  Height '6\' 4"'$  '6\' 4"'$  '6\' 4"'$   Weight 263 lbs 6 oz 261 lbs 3 oz 261 lbs 3 oz  BMI 32.08 01.09 32.35  Systolic 573 220 254  Diastolic 86 80 76  Pulse 71 73 61    Today's Vitals   01/11/22 0954  BP: (!) 147/86  Pulse: 71  Resp: 18  Temp: 97.8 F (36.6 C)  TempSrc: Oral  SpO2: 99%  Weight: 263 lb 6.4 oz (119.5 kg)  Height: '6\' 4"'$  (1.93 m)  PainSc: 3   PainLoc: Wrist   Body mass index is 32.06 kg/m.     01/04/2021    8:36 AM 11/29/2020   11:48 AM 10/20/2019   11:22 AM 04/02/2018    2:58 PM 04/01/2017    2:04 PM 10/04/2016    2:27 PM  Advanced Directives  Does Patient Have a Medical Advance Directive? No Yes Yes Yes Yes Yes  Type of Corporate treasurer of [place];Living will Living will;Healthcare Power of [place];Living will [place];Living will [place];Living will  Copy of [place] in Chart?  No - copy requested No - copy requested No - copy requested No - copy requested   Would patient like information on creating a medical advance directive? No - Patient declined         Current Medications (verified) Outpatient Encounter Medications as of 01/11/2022  Medication Sig   aspirin 81 MG tablet Take 1 tablet (81 mg total) by mouth daily.   metoprolol succinate (TOPROL-XL) 50 MG 24 hr tablet Take 1 tablet (50 mg total) by mouth daily. Take with or immediately following a meal.   Multiple Vitamins-Minerals (MULTIVITAMIN WITH MINERALS) tablet Take 1 tablet by mouth daily.   NON FORMULARY CPAP nightly   olmesartan (BENICAR) 40 MG tablet TAKE 1 TABLET(40 MG) BY MOUTH DAILY   Omega-3 Fatty  Acids (FISH OIL) 1000 MG CAPS Take 3 capsules by mouth 2 (two) times daily.   triamcinolone 0.1%-Cerave equivalent 1:1 cream mixture Apply topically 2 (two) times daily as needed.   vitamin B-12 (CYANOCOBALAMIN) 500 MCG tablet Take 500 mcg by mouth daily.   No facility-administered encounter medications on file as of 01/11/2022.    Allergies (verified) Patient has no known allergies.   History: Past Medical History:  Diagnosis Date   A-fib [facility])    Arthritis    FH: heart failure 10/04/2016   Former smoker    Heart murmur    Hypertension    Malaria    Stroke [facility])    TB lung, latent    Completed 4 months of LTBI tx with Rifampin ~09/09/2020   Past Surgical History:  Procedure Laterality Date   COLONOSCOPY  06/12/2016   FRACTURE SURGERY     KNEE REPLACE Bilateral 2017   LUMBAR FUSION  1995   L3 L4   Family History  Problem Relation Age of Onset   Heart disease Mother    Heart disease Father    Gout Sister    Heart attack Son    Social History  Socioeconomic History   Marital status: Married    Spouse name: Daniel Brandt   Number of children: 6   Years of education: Not on file   Highest education level: Master's degree (e.g., MA, MS, MEng, MEd, MSW, MBA)  Occupational History   Occupation: Retired  Tobacco Use   Smoking status: Former    Packs/day: 1.00    Years: 0.50    Total pack years: 0.50    Types: Cigarettes    Quit date: 10/04/1956    Years since quitting: 65.3   Smokeless tobacco: Former   Tobacco comments:    smoking cessation materials not required  Vaping Use   Vaping Use: Never used  Substance and Sexual Activity   Alcohol use: Yes    Alcohol/week: 28.0 standard drinks of alcohol    Types: 28 Shots of liquor per week    Comment: Scotch   Drug use: No   Sexual activity: Not Currently  Other Topics Concern   Not on file  Social History Narrative   Not on file   Social Determinants of Health   Financial Resource Strain: Low Risk   (01/11/2022)   Overall Financial Resource Strain (CARDIA)    Difficulty of Paying Living Expenses: Not hard at all  Food Insecurity: No Food Insecurity (01/11/2022)   Hunger Vital Sign    Worried About Running Out of Food in the Last Year: Never true    Ran Out of Food in the Last Year: Never true  Transportation Needs: No Transportation Needs (01/11/2022)   PRAPARE - Hydrologist (Medical): No    Lack of Transportation (Non-Medical): No  Physical Activity: Inactive (11/29/2020)   Exercise Vital Sign    Days of Exercise per Week: 0 days    Minutes of Exercise per Session: 0 min  Stress: No Stress Concern Present (01/11/2022)   [place]    Feeling of Stress : Not at all  Social Connections: [place] (01/11/2022)   Social Connection and Isolation Panel [NHANES]    Frequency of Communication with Friends and Family: More than three times a week    Frequency of Social Gatherings with Friends and Family: More than three times a week    Attends Religious Services: More than 4 times per year    Active Member of [employer] or Organizations: Yes    Attends Archivist Meetings: Never    Marital Status: Married    Tobacco Counseling Counseling given: Not Answered Tobacco comments: smoking cessation materials not required   Clinical Intake:  Pre-visit preparation completed: No  Pain : 0-10 Pain Score: 3  Pain Type: Chronic pain Pain Location: Wrist Pain Orientation: Right, Left Pain Descriptors / Indicators: Aching Pain Onset: More than a month ago Pain Frequency: Intermittent    Diabetic? No          Activities of Daily Living    01/11/2022    9:50 AM  In your present state of health, do you have any difficulty performing the following activities:  Hearing? 1  Vision? 1  Difficulty concentrating or making decisions? 0  Walking or climbing stairs? 1  Dressing or  bathing? 0  Doing errands, shopping? 0    Patient Care Team: Olin Hauser, DO as PCP - General (Family Medicine) Deanna Artis, MD as Referring Physician (Gastroenterology) Serita Grit, MD as Referring Physician (Dermatology)  Indicate any recent Medical Services you may have received  from other than [facility] providers in the past year (date may be approximate).  No hospitalization in the past 12 months.      Assessment:   This is a routine wellness examination for Daniel Brandt.  Hearing/Vision screen No results found.  Dietary issues and exercise activities discussed: Current Exercise Habits: The patient does not participate in regular exercise at present, Exercise limited by: orthopedic condition(s)   Goals Addressed   None    Depression Screen    01/11/2022    9:47 AM 10/13/2021    8:02 AM 08/09/2021   11:01 AM 01/12/2021   11:11 AM 11/29/2020   11:51 AM 04/19/2020    9:17 AM 10/20/2019   11:23 AM  PHQ 2/9 Scores  PHQ - 2 Score 1 3 0 0 0 0 0  PHQ- 9 Score 4 6  0       Fall Risk    01/11/2022    9:46 AM 10/13/2021    8:02 AM 08/09/2021   11:01 AM 01/12/2021   11:11 AM 11/29/2020   11:50 AM  Fall Risk   Falls in the past year? 0 0 0 0 0  Number falls in past yr: 0 0 0 0   Injury with Fall? 0 0 0 0   Risk for fall due to : No Fall Risks No Fall Risks No Fall Risks  Medication side effect  Follow up Falls evaluation completed Falls evaluation completed Falls evaluation completed Falls evaluation completed Falls evaluation completed;Education provided;Falls prevention discussed    FALL RISK PREVENTION PERTAINING TO THE HOME:  Any stairs in or around the home? No  If so, are there any without handrails?  No stairs in the home  Home free of loose throw rugs in walkways, pet beds, electrical cords, etc? No  Adequate lighting in your home to reduce risk of falls? Yes   ASSISTIVE DEVICES UTILIZED TO PREVENT FALLS:  Life alert? No  Use of a cane, walker or w/c? No  Grab bars  in the bathroom? Yes  Shower chair or bench in shower? Yes  Elevated toilet seat or a handicapped toilet? Yes  TIMED UP AND GO:  Was the test performed? Yes .  Length of time to ambulate 10 feet: 8  sec.   Gait steady and fast without use of assistive device  Cognitive Function:        01/11/2022    9:52 AM 11/29/2020   11:56 AM 10/20/2019   11:27 AM 04/02/2018    2:59 PM 04/01/2017    2:16 PM  6CIT Screen  What Year? 0 points 0 points 0 points 0 points 0 points  What month? 0 points 0 points 0 points 0 points 0 points  What time? 0 points 0 points 0 points 0 points 0 points  Count back from 20 0 points 0 points 0 points 0 points 0 points  Months in reverse 0 points 0 points 0 points 0 points 0 points  Repeat phrase 0 points 0 points 0 points 0 points 0 points  Total Score 0 points 0 points 0 points 0 points 0 points    Immunizations Immunization History  Administered Date(s) Administered   Influenza, High Dose Seasonal PF 04/01/2017, 04/02/2018   Influenza,inj,Quad PF,6+ Mos 03/09/2016   PFIZER Comirnaty(Gray Top)Covid-19 Tri-Sucrose Vaccine 09/06/2019   PFIZER(Purple Top)SARS-COV-2 Vaccination 09/06/2019   Pneumococcal Conjugate-13 03/09/2016   Pneumococcal Polysaccharide-23 10/09/2017   Tdap 10/04/2016   Zoster Recombinat (Shingrix) 08/20/2016, 12/21/2016    TDAP status: Up  to date  Flu Vaccine status: Up to date  Pneumococcal vaccine status: Up to date  Covid-19 vaccine status: Declined, Education has been provided regarding the importance of this vaccine but patient still declined. Advised may receive this vaccine at local pharmacy or Health Dept.or vaccine clinic. Aware to provide a copy of the vaccination record if obtained from local pharmacy or Health Dept. Verbalized acceptance and understanding.  Qualifies for Shingles Vaccine? Yes   Zostavax completed Yes   Shingrix Completed?: Yes  Screening Tests Health Maintenance  Topic Date Due   COVID-19 Vaccine  (3 - Pfizer risk series) 10/04/2019   INFLUENZA VACCINE  02/06/2022   TETANUS/TDAP  10/05/2026   Pneumonia Vaccine 58+ Years old  Completed   Zoster Vaccines- Shingrix  Completed   HPV VACCINES  Aged Out    Health Maintenance  Health Maintenance Due  Topic Date Due   COVID-19 Vaccine (3 - Pfizer risk series) 10/04/2019    Colorectal cancer screening: No longer required.   Lung Cancer Screening: (Low Dose CT Chest recommended if Age 41-80 years, 30 pack-year currently smoking OR have quit w/in 15years.) does not qualify.   Lung Cancer Screening Referral: not required  Additional Screening:  Hepatitis C Screening: does qualify; Completed   Vision Screening: Recommended annual ophthalmology exams for early detection of glaucoma and other disorders of the eye. Is the patient up to date with their annual eye exam?  Yes  Who is the provider or what is the name of the office in which the patient attends annual eye exams? [facility]  If pt is not established with a provider, would they like to be referred to a provider to establish care? Yes .   Dental Screening: Recommended annual dental exams for proper oral hygiene  Community Resource Referral / Chronic Care Management: CRR required this visit?  Yes   CCM required this visit?  Yes      Plan:     I have personally reviewed and noted the following in the patient's chart:   Medical and social history Use of alcohol, tobacco or illicit drugs  Current medications and supplements including opioid prescriptions. Patient is not currently taking opioid prescriptions. Functional ability and status Nutritional status Physical activity Advanced directives List of other physicians Hospitalizations, surgeries, and ER visits in previous 12 months Vitals Screenings to include cognitive, depression, and falls Referrals and appointments  In addition, I have reviewed and discussed with patient certain preventive protocols, quality  metrics, and best practice recommendations. A written personalized care plan for preventive services as well as general preventive health recommendations were provided to patient.    Daniel Brandt , Thank you for taking time to come for your Medicare Wellness Visit. I appreciate your ongoing commitment to your health goals. Please review the following plan we discussed and let me know if I can assist you in the future.   These are the goals we discussed:  Goals      Increase water intake     Recommend increasing fluid intake to 5-6 glasses throughout entire day     Patient Stated     11/29/2020, no goals     Prevent falls     Recommend to remove any items from the home that may cause slips or trips.        This is a list of the screening recommended for you and due dates:  Health Maintenance  Topic Date Due   COVID-19 Vaccine (3 - Pfizer risk  series) 10/04/2019   Flu Shot  02/06/2022   Tetanus Vaccine  10/05/2026   Pneumonia Vaccine  Completed   Zoster (Shingles) Vaccine  Completed   HPV Vaccine  Aged [address] [place]   01/11/2022   Nurses Note: The pt scheduled for an Annual Exam with Dr. Parks Ranger on 01/19/2022. He came into the office today prepared to get his labs drawn. I informed the patient that Dr. Raliegh Ip will discuss his results at his PE appt on 01/19/2022.

## 2022-01-11 NOTE — Patient Instructions (Signed)
Health Maintenance, Male Adopting a healthy lifestyle and getting preventive care are important in promoting health and wellness. Ask your health care provider about: The right schedule for you to have regular tests and exams. Things you can do on your own to prevent diseases and keep yourself healthy. What should I know about diet, weight, and exercise? Eat a healthy diet  Eat a diet that includes plenty of vegetables, fruits, low-fat dairy products, and lean protein. Do not eat a lot of foods that are high in solid fats, added sugars, or sodium. Maintain a healthy weight Body mass index (BMI) is a measurement that can be used to identify possible weight problems. It estimates body fat based on height and weight. Your health care provider can help determine your BMI and help you achieve or maintain a healthy weight. Get regular exercise Get regular exercise. This is one of the most important things you can do for your health. Most adults should: Exercise for at least 150 minutes each week. The exercise should increase your heart rate and make you sweat (moderate-intensity exercise). Do strengthening exercises at least twice a week. This is in addition to the moderate-intensity exercise. Spend less time sitting. Even light physical activity can be beneficial. Watch cholesterol and blood lipids Have your blood tested for lipids and cholesterol at 86 years of age, then have this test every 5 years. You may need to have your cholesterol levels checked more often if: Your lipid or cholesterol levels are high. You are older than 86 years of age. You are at high risk for heart disease. What should I know about cancer screening? Many types of cancers can be detected early and may often be prevented. Depending on your health history and family history, you may need to have cancer screening at various ages. This may include screening for: Colorectal cancer. Prostate cancer. Skin cancer. Lung  cancer. What should I know about heart disease, diabetes, and high blood pressure? Blood pressure and heart disease High blood pressure causes heart disease and increases the risk of stroke. This is more likely to develop in people who have high blood pressure readings or are overweight. Talk with your health care provider about your target blood pressure readings. Have your blood pressure checked: Every 3-5 years if you are 18-39 years of age. Every year if you are 40 years old or older. If you are between the ages of 65 and 75 and are a current or former smoker, ask your health care provider if you should have a one-time screening for abdominal aortic aneurysm (AAA). Diabetes Have regular diabetes screenings. This checks your fasting blood sugar level. Have the screening done: Once every three years after age 45 if you are at a normal weight and have a low risk for diabetes. More often and at a younger age if you are overweight or have a high risk for diabetes. What should I know about preventing infection? Hepatitis B If you have a higher risk for hepatitis B, you should be screened for this virus. Talk with your health care provider to find out if you are at risk for hepatitis B infection. Hepatitis C Blood testing is recommended for: Everyone born from 1945 through 1965. Anyone with known risk factors for hepatitis C. Sexually transmitted infections (STIs) You should be screened each year for STIs, including gonorrhea and chlamydia, if: You are sexually active and are younger than 86 years of age. You are older than 86 years of age and your   health care provider tells you that you are at risk for this type of infection. Your sexual activity has changed since you were last screened, and you are at increased risk for chlamydia or gonorrhea. Ask your health care provider if you are at risk. Ask your health care provider about whether you are at high risk for HIV. Your health care provider  may recommend a prescription medicine to help prevent HIV infection. If you choose to take medicine to prevent HIV, you should first get tested for HIV. You should then be tested every 3 months for as long as you are taking the medicine. Follow these instructions at home: Alcohol use Do not drink alcohol if your health care provider tells you not to drink. If you drink alcohol: Limit how much you have to 0-2 drinks a day. Know how much alcohol is in your drink. In the [place] one drink equals one 12 oz bottle of beer (355 mL), one 5 oz glass of wine (148 mL), or one 1 oz glass of hard liquor (44 mL). Lifestyle Do not use any products that contain nicotine or tobacco. These products include cigarettes, chewing tobacco, and vaping devices, such as e-cigarettes. If you need help quitting, ask your health care provider. Do not use street drugs. Do not share needles. Ask your health care provider for help if you need support or information about quitting drugs. General instructions Schedule regular health, dental, and eye exams. Stay current with your vaccines. Tell your health care provider if: You often feel depressed. You have ever been abused or do not feel safe at home. Summary Adopting a healthy lifestyle and getting preventive care are important in promoting health and wellness. Follow your health care provider's instructions about healthy diet, exercising, and getting tested or screened for diseases. Follow your health care provider's instructions on monitoring your cholesterol and blood pressure. This information is not intended to replace advice given to you by your health care provider. Make sure you discuss any questions you have with your health care provider. Document Revised: 11/14/2020 Document Reviewed: 11/14/2020 Elsevier Patient Education  2023 Elsevier Inc.  

## 2022-01-19 ENCOUNTER — Encounter: Payer: Medicare Other | Admitting: Family Medicine

## 2022-01-22 ENCOUNTER — Ambulatory Visit (INDEPENDENT_AMBULATORY_CARE_PROVIDER_SITE_OTHER): Payer: Medicare Other | Admitting: Internal Medicine

## 2022-01-22 ENCOUNTER — Encounter: Payer: Self-pay | Admitting: Internal Medicine

## 2022-01-22 VITALS — BP 126/75 | HR 80 | Ht 76.0 in | Wt 263.3 lb

## 2022-01-22 DIAGNOSIS — I482 Chronic atrial fibrillation, unspecified: Secondary | ICD-10-CM

## 2022-01-22 DIAGNOSIS — G4733 Obstructive sleep apnea (adult) (pediatric): Secondary | ICD-10-CM

## 2022-01-22 DIAGNOSIS — Z9989 Dependence on other enabling machines and devices: Secondary | ICD-10-CM

## 2022-01-22 DIAGNOSIS — G609 Hereditary and idiopathic neuropathy, unspecified: Secondary | ICD-10-CM

## 2022-01-22 DIAGNOSIS — M170 Bilateral primary osteoarthritis of knee: Secondary | ICD-10-CM | POA: Diagnosis not present

## 2022-01-22 DIAGNOSIS — I129 Hypertensive chronic kidney disease with stage 1 through stage 4 chronic kidney disease, or unspecified chronic kidney disease: Secondary | ICD-10-CM

## 2022-01-22 DIAGNOSIS — N183 Chronic kidney disease, stage 3 unspecified: Secondary | ICD-10-CM

## 2022-01-22 DIAGNOSIS — M5136 Other intervertebral disc degeneration, lumbar region: Secondary | ICD-10-CM

## 2022-01-22 DIAGNOSIS — M51369 Other intervertebral disc degeneration, lumbar region without mention of lumbar back pain or lower extremity pain: Secondary | ICD-10-CM

## 2022-01-22 MED ORDER — APIXABAN 5 MG PO TABS: 5.0000 mg | ORAL_TABLET | Freq: Two times a day (BID) | ORAL | 1 refills | Status: DC

## 2022-01-22 NOTE — Progress Notes (Signed)
Established Patient Office Visit  Subjective:  Patient ID: Daniel Brandt, male    DOB: January 17, 1934  Age: 88 y.o. MRN: [id number]  CC:  Chief Complaint  Patient presents with   Follow-up    HPI  Daniel Brandt presents for check up  Past Medical History:  Diagnosis Date   A-fib [facility])    Arthritis    FH: heart failure 10/04/2016   Former smoker    Heart murmur    Hypertension    Malaria    Stroke [facility])    TB lung, latent    Completed 4 months of LTBI tx with Rifampin ~09/09/2020    Past Surgical History:  Procedure Laterality Date   COLONOSCOPY  06/12/2016   FRACTURE SURGERY     KNEE REPLACE Bilateral 2017   LUMBAR FUSION  1995   L3 L4    Family History  Problem Relation Age of Onset   Heart disease Mother    Heart disease Father    Gout Sister    Heart attack Son     Social History   Socioeconomic History   Marital status: Married    Spouse name: Dashiell Franchino   Number of children: 6   Years of education: Not on file   Highest education level: Master's degree (e.g., MA, MS, MEng, MEd, MSW, MBA)  Occupational History   Occupation: Retired  Tobacco Use   Smoking status: Former    Packs/day: 1.00    Years: 0.50    Total pack years: 0.50    Types: Cigarettes    Quit date: 10/04/1956    Years since quitting: 65.3   Smokeless tobacco: Former   Tobacco comments:    smoking cessation materials not required  Vaping Use   Vaping Use: Never used  Substance and Sexual Activity   Alcohol use: Yes    Alcohol/week: 28.0 standard drinks of alcohol    Types: 28 Shots of liquor per week    Comment: Scotch   Drug use: No   Sexual activity: Not Currently  Other Topics Concern   Not on file  Social History Narrative   Not on file   Social Determinants of Health   Financial Resource Strain: Low Risk  (01/11/2022)   Overall Financial Resource Strain (CARDIA)    Difficulty of Paying Living Expenses: Not hard at all  Food Insecurity: No Food Insecurity  (01/11/2022)   Hunger Vital Sign    Worried About Running Out of Food in the Last Year: Never true    Ran Out of Food in the Last Year: Never true  Transportation Needs: No Transportation Needs (01/11/2022)   PRAPARE - Hydrologist (Medical): No    Lack of Transportation (Non-Medical): No  Physical Activity: Inactive (11/29/2020)   Exercise Vital Sign    Days of Exercise per Week: 0 days    Minutes of Exercise per Session: 0 min  Stress: No Stress Concern Present (01/11/2022)   [place]    Feeling of Stress : Not at all  Social Connections: [place] (01/11/2022)   Social Connection and Isolation Panel [NHANES]    Frequency of Communication with Friends and Family: More than three times a week    Frequency of Social Gatherings with Friends and Family: More than three times a week    Attends Religious Services: More than 4 times per year    Active Member of [employer] or Organizations: Yes  Attends Archivist Meetings: Never    Marital Status: Married  Human resources officer Violence: Not At Risk (01/11/2022)   Humiliation, Afraid, Rape, and Kick questionnaire    Fear of Current or Ex-Partner: No    Emotionally Abused: No    Physically Abused: No    Sexually Abused: No     Current Outpatient Medications:    aspirin 81 MG tablet, Take 1 tablet (81 mg total) by mouth daily., Disp: 30 tablet, Rfl:    metoprolol succinate (TOPROL-XL) 50 MG 24 hr tablet, Take 1 tablet (50 mg total) by mouth daily. Take with or immediately following a meal., Disp: 90 tablet, Rfl: 3   Multiple Vitamins-Minerals (MULTIVITAMIN WITH MINERALS) tablet, Take 1 tablet by mouth daily., Disp: , Rfl:    NON FORMULARY, CPAP nightly, Disp: , Rfl:    olmesartan (BENICAR) 40 MG tablet, TAKE 1 TABLET(40 MG) BY MOUTH DAILY, Disp: 90 tablet, Rfl: 3   Omega-3 Fatty Acids (FISH OIL) 1000 MG CAPS, Take 3 capsules by mouth 2  (two) times daily., Disp: , Rfl:    triamcinolone 0.1%-Cerave equivalent 1:1 cream mixture, Apply topically 2 (two) times daily as needed., Disp: 454 g, Rfl: 2   vitamin B-12 (CYANOCOBALAMIN) 500 MCG tablet, Take 500 mcg by mouth daily., Disp: , Rfl:    No Known Allergies  ROS Review of Systems  Constitutional: Negative.   HENT: Negative.    Eyes: Negative.   Respiratory: Negative.    Cardiovascular: Negative.   Gastrointestinal: Negative.   Endocrine: Negative.   Genitourinary: Negative.   Musculoskeletal: Negative.   Skin: Negative.   Allergic/Immunologic: Negative.   Neurological: Negative.   Hematological: Negative.   Psychiatric/Behavioral: Negative.    All other systems reviewed and are negative.     Objective:    Physical Exam Vitals reviewed.  Constitutional:      Appearance: Normal appearance.  HENT:     Mouth/Throat:     Mouth: Mucous membranes are moist.  Eyes:     Pupils: Pupils are equal, round, and reactive to light.  Neck:     Vascular: No carotid bruit.  Cardiovascular:     Rate and Rhythm: Normal rate. Rhythm irregular.     Pulses: Normal pulses.     Heart sounds: Normal heart sounds.  Pulmonary:     Effort: Pulmonary effort is normal.     Breath sounds: Normal breath sounds.  Abdominal:     General: Bowel sounds are normal.     Palpations: Abdomen is soft. There is no hepatomegaly, splenomegaly or mass.     Tenderness: There is no abdominal tenderness.     Hernia: No hernia is present.  Musculoskeletal:     Cervical back: Neck supple.     Right lower leg: No edema.     Left lower leg: No edema.  Skin:    Findings: No rash.  Neurological:     Mental Status: He is alert and oriented to person, place, and time.     Motor: No weakness.  Psychiatric:        Mood and Affect: Mood normal.        Behavior: Behavior normal.     BP 126/75   Pulse 80   Ht '6\' 4"'$  (1.93 m)   Wt 263 lb 4.8 oz (119.4 kg)   BMI 32.05 kg/m  Wt Readings from Last  3 Encounters:  01/22/22 263 lb 4.8 oz (119.4 kg)  01/11/22 263 lb 6.4 oz (119.5 kg)  10/23/21  261 lb 3.2 oz (118.5 kg)     Health Maintenance Due  Topic Date Due   COVID-19 Vaccine (3 - Pfizer risk series) 10/04/2019    There are no preventive care reminders to display for this patient.  Lab Results  Component Value Date   TSH 4.77 (H) 04/12/2020   Lab Results  Component Value Date   WBC 6.2 05/09/2020   HGB 13.4 05/09/2020   HCT 38.8 05/09/2020   MCV 104 (H) 05/09/2020   PLT 192 05/09/2020   Lab Results  Component Value Date   NA 138 04/12/2020   K 4.2 04/12/2020   CO2 23 04/12/2020   GLUCOSE 93 04/12/2020   BUN 14 04/12/2020   CREATININE 1.20 (H) 04/12/2020   BILITOT 0.3 08/10/2020   ALKPHOS 61 08/10/2020   AST 19 08/10/2020   ALT 18 08/10/2020   PROT 6.5 08/10/2020   ALBUMIN 4.3 08/10/2020   CALCIUM 9.0 04/12/2020   Lab Results  Component Value Date   CHOL 152 04/12/2020   Lab Results  Component Value Date   HDL 41 04/12/2020   Lab Results  Component Value Date   LDLCALC 78 04/12/2020   Lab Results  Component Value Date   TRIG 236 (H) 04/12/2020   Lab Results  Component Value Date   CHOLHDL 3.7 04/12/2020   Lab Results  Component Value Date   HGBA1C 5.3 04/12/2020      Assessment & Plan:   Problem List Items Addressed This Visit       Cardiovascular and Mediastinum   Benign hypertension with CKD (chronic kidney disease) stage III ([place]) - Primary     Patient denies any chest pain or shortness of breath there is no history of palpitation or paroxysmal nocturnal dyspnea   patient was advised to follow low-salt low-cholesterol diet    ideally I want to keep systolic blood pressure below 130 mmHg, patient was asked to check blood pressure one times a week and give me a report on that.  Patient will be follow-up in 3 months  or earlier as needed, patient will call me back for any change in the cardiovascular symptoms Patient was advised to  buy a book from local bookstore concerning blood pressure and read several chapters  every day.  This will be supplemented by some of the material we will give him from the office.  Patient should also utilize other resources like YouTube and Internet to learn more about the blood pressure and the diet.      Chronic atrial fibrillation    Patient was started on Eliquis        Respiratory   OSA on CPAP    Patient was advised to use the CPAP machine and try to lose weight        Nervous and Auditory   Peripheral neuropathy, idiopathic    Chronic problem        Musculoskeletal and Integument   Osteoarthritis of both knees   DDD (degenerative disc disease), lumbar    Advised to lose weight       No orders of the defined types were placed in this encounter.   Follow-up: No follow-ups on file.    Cletis [place], MD

## 2022-01-22 NOTE — Addendum Note (Signed)
Addended by: Alois Cliche on: 01/22/2022 11:47 AM   Modules accepted: Orders

## 2022-01-22 NOTE — Assessment & Plan Note (Signed)
Chronic problem. 

## 2022-01-22 NOTE — Assessment & Plan Note (Signed)

## 2022-01-22 NOTE — Assessment & Plan Note (Signed)
Advised to lose weight 

## 2022-01-22 NOTE — Assessment & Plan Note (Signed)
Patient was advised to use the CPAP machine and try to lose weight

## 2022-01-22 NOTE — Assessment & Plan Note (Signed)
Patient was started on Eliquis

## 2022-01-23 ENCOUNTER — Encounter: Payer: Self-pay | Admitting: Family Medicine

## 2022-01-23 ENCOUNTER — Ambulatory Visit: Payer: Medicare Other | Admitting: Family Medicine

## 2022-01-23 ENCOUNTER — Ambulatory Visit (INDEPENDENT_AMBULATORY_CARE_PROVIDER_SITE_OTHER): Payer: Medicare Other | Admitting: Family Medicine

## 2022-01-23 VITALS — BP 136/84 | HR 76 | Ht 76.0 in | Wt 261.0 lb

## 2022-01-23 DIAGNOSIS — I482 Chronic atrial fibrillation, unspecified: Secondary | ICD-10-CM | POA: Diagnosis not present

## 2022-01-23 DIAGNOSIS — Z0000 Encounter for general adult medical examination without abnormal findings: Secondary | ICD-10-CM | POA: Diagnosis not present

## 2022-01-23 DIAGNOSIS — R7309 Other abnormal glucose: Secondary | ICD-10-CM

## 2022-01-23 DIAGNOSIS — Z9989 Dependence on other enabling machines and devices: Secondary | ICD-10-CM | POA: Diagnosis not present

## 2022-01-23 DIAGNOSIS — R351 Nocturia: Secondary | ICD-10-CM

## 2022-01-23 DIAGNOSIS — E781 Pure hyperglyceridemia: Secondary | ICD-10-CM | POA: Diagnosis not present

## 2022-01-23 DIAGNOSIS — G4733 Obstructive sleep apnea (adult) (pediatric): Secondary | ICD-10-CM

## 2022-01-23 DIAGNOSIS — I129 Hypertensive chronic kidney disease with stage 1 through stage 4 chronic kidney disease, or unspecified chronic kidney disease: Secondary | ICD-10-CM | POA: Diagnosis not present

## 2022-01-23 DIAGNOSIS — N183 Chronic kidney disease, stage 3 unspecified: Secondary | ICD-10-CM | POA: Diagnosis not present

## 2022-01-23 NOTE — Assessment & Plan Note (Signed)
Well controlled, chronic OSA on CPAP - Good adherence to CPAP nightly - Continue current CPAP therapy, patient seems to be benefiting from therapy  

## 2022-01-23 NOTE — Assessment & Plan Note (Signed)
Chronic AFib Now recently advised by Cardiology 2nd opinion to start with Eliquis '5mg'$  BID, initial instructions were daily, asked patient to clarify BID dosing and pursue recommendations from them

## 2022-01-23 NOTE — Assessment & Plan Note (Signed)
Mild elevated BP On Olmesartan '40mg'$  and Metoprolol XL '50mg'$  daily Also followed by Cardiology Dr Lavera Guise

## 2022-01-23 NOTE — Progress Notes (Signed)
Subjective:    Patient ID: Daniel Brandt, male    DOB: 1934/03/18, 88 y.o.   MRN: [id number]  Daniel Brandt is a 88 y.o. male presenting on 01/23/2022 for Annual Exam   HPI  Here for Annual Physical and Lab Review  He is followed by Dr Daniel Brandt for Cardiology input recently, seen yesterday.   Chronic Atrial Fibrillation He was given Eliquis '5mg'$  daily as recommendation for anticoagulation for prophylaxis AFib.  Psoriatic Arthritis Followed by Dr Daniel Brandt [facility] Rheumatology - He had been given diagnosis of Psoriatic Arthritis, instead of dermatomyositis Previously at [facility] Dermatology    Chronic Back Pain / Lumbar DDD with spinal stenosis History of prior back fracture. 1995 had lumbar back surgery. Had complication 1 year ago, and was offered repeat surgeon  - Followed by Daniel Brandt Neurosurgery / Pain Anesthesia - followed by Dr Daniel Brandt and Daniel Dillon NP, they are doing  - He has difficulty with numbness in lower extremity and some weakness, now he says pain in back is more dull and less often. He has improved sensation in Right lower leg now. - Prior knee replacement surgery, other injuries.   Atrial Fibrillation / CHRONIC HTN: Reports no new concerns. No episodic AFib problems. Followed by Dr Daniel Brandt [facility]iology) recently retired. He does report fatigue and decreased energy Current Meds - Metoprolol XL '100mg'$ , Olmesartan '40mg'$  daily Reports good compliance, took meds today. Tolerating well, w/o complaints. Denies CP, dyspnea, HA, edema, dizziness / lightheadedness  Hyperlipidemia / Elevated TG Followed by Dr Daniel Brandt [facility] - Reports no concerns. Last lipid panel 04/2020 elevated TG 236 - Taking Omega 3 2,000 twice a day Off statin      01/23/2022    9:09 AM 01/11/2022    9:47 AM 10/13/2021    8:02 AM  Depression screen PHQ 2/9  Decreased Interest 1 0 3  Down, Depressed, Hopeless 0 1 0  PHQ - 2 Score '1 1 3  '$ Altered sleeping 0 0 0  Tired,  decreased energy '1 3 3  '$ Change in appetite 0 0 0  Feeling bad or failure about yourself  1 0 0  Trouble concentrating 0 0 0  Moving slowly or fidgety/restless 0 0 0  Suicidal thoughts 0 0 0  PHQ-9 Score '3 4 6  '$ Difficult doing work/chores Somewhat difficult Not difficult at all Extremely dIfficult    Past Medical History:  Diagnosis Date   A-fib [facility])    Arthritis    FH: heart failure 10/04/2016   Former smoker    Heart murmur    Hypertension    Malaria    Stroke ([place])    TB lung, latent    Completed 4 months of LTBI tx with Rifampin ~09/09/2020   Past Surgical History:  Procedure Laterality Date   COLONOSCOPY  06/12/2016   FRACTURE SURGERY     KNEE REPLACE Bilateral 2017   LUMBAR FUSION  1995   L3 L4   Social History   Socioeconomic History   Marital status: Married    Spouse name: Daniel Brandt   Number of children: 6   Years of education: Not on file   Highest education level: Master's degree (e.g., MA, MS, MEng, MEd, MSW, MBA)  Occupational History   Occupation: Retired  Tobacco Use   Smoking status: Former    Packs/day: 1.00    Years: 0.50    Total pack years: 0.50    Types: Cigarettes    Quit date: 10/04/1956  Years since quitting: 65.3   Smokeless tobacco: Former   Tobacco comments:    smoking cessation materials not required  Vaping Use   Vaping Use: Never used  Substance and Sexual Activity   Alcohol use: Yes    Alcohol/week: 28.0 standard drinks of alcohol    Types: 28 Shots of liquor per week    Comment: Scotch   Drug use: No   Sexual activity: Not Currently  Other Topics Concern   Not on file  Social History Narrative   Not on file   Social Determinants of Health   Financial Resource Strain: Low Risk  (01/11/2022)   Overall Financial Resource Strain (CARDIA)    Difficulty of Paying Living Expenses: Not hard at all  Food Insecurity: No Food Insecurity (01/11/2022)   Hunger Vital Sign    Worried About Running Out of Food in the Last Year:  Never true    Ran Out of Food in the Last Year: Never true  Transportation Needs: No Transportation Needs (01/11/2022)   PRAPARE - Hydrologist (Medical): No    Lack of Transportation (Non-Medical): No  Physical Activity: Inactive (11/29/2020)   Exercise Vital Sign    Days of Exercise per Week: 0 days    Minutes of Exercise per Session: 0 min  Stress: No Stress Concern Present (01/11/2022)   [place]    Feeling of Stress : Not at all  Social Connections: [place] (01/11/2022)   Social Connection and Isolation Panel [NHANES]    Frequency of Communication with Friends and Family: More than three times a week    Frequency of Social Gatherings with Friends and Family: More than three times a week    Attends Religious Services: More than 4 times per year    Active Member of [employer] or Organizations: Yes    Attends Archivist Meetings: Never    Marital Status: Married  Human resources officer Violence: Not At Risk (01/11/2022)   Humiliation, Afraid, Rape, and Kick questionnaire    Fear of Current or Ex-Partner: No    Emotionally Abused: No    Physically Abused: No    Sexually Abused: No   Family History  Problem Relation Age of Onset   Heart disease Mother    Heart disease Father    Gout Sister    Heart attack Son    Current Outpatient Medications on File Prior to Visit  Medication Sig   apixaban (ELIQUIS) 5 MG TABS tablet Take 1 tablet (5 mg total) by mouth 2 (two) times daily.   aspirin 81 MG tablet Take 1 tablet (81 mg total) by mouth daily.   metoprolol succinate (TOPROL-XL) 50 MG 24 hr tablet Take 1 tablet (50 mg total) by mouth daily. Take with or immediately following a meal.   Multiple Vitamins-Minerals (MULTIVITAMIN WITH MINERALS) tablet Take 1 tablet by mouth daily.   NON FORMULARY CPAP nightly   olmesartan (BENICAR) 40 MG tablet TAKE 1 TABLET(40 MG) BY MOUTH DAILY    Omega-3 Fatty Acids (FISH OIL) 1000 MG CAPS Take 3 capsules by mouth 2 (two) times daily.   triamcinolone 0.1%-Cerave equivalent 1:1 cream mixture Apply topically 2 (two) times daily as needed.   vitamin B-12 (CYANOCOBALAMIN) 500 MCG tablet Take 500 mcg by mouth daily.   No current facility-administered medications on file prior to visit.    Review of Systems  Constitutional:  Negative for activity change, appetite change, chills, diaphoresis,  fatigue and fever.  HENT:  Negative for congestion and hearing loss.   Eyes:  Negative for visual disturbance.  Respiratory:  Negative for cough, chest tightness, shortness of breath and wheezing.   Cardiovascular:  Negative for chest pain, palpitations and leg swelling.  Gastrointestinal:  Negative for abdominal pain, constipation, diarrhea, nausea and vomiting.  Genitourinary:  Negative for dysuria, frequency and hematuria.  Musculoskeletal:  Negative for arthralgias and neck pain.  Skin:  Negative for rash.  Neurological:  Negative for dizziness, weakness, light-headedness, numbness and headaches.  Hematological:  Negative for adenopathy.  Psychiatric/Behavioral:  Negative for behavioral problems, dysphoric mood and sleep disturbance.    Per HPI unless specifically indicated above      Objective:    BP 136/84 (BP Location: Left Arm, Cuff Size: Normal)   Pulse 76   Ht '6\' 4"'$  (1.93 m)   Wt 261 lb (118.4 kg)   SpO2 100%   BMI 31.77 kg/m   Wt Readings from Last 3 Encounters:  01/23/22 261 lb (118.4 kg)  01/22/22 263 lb 4.8 oz (119.4 kg)  01/11/22 263 lb 6.4 oz (119.5 kg)    Physical Exam Vitals and nursing note reviewed.  Constitutional:      General: He is not in acute distress.    Appearance: He is well-developed. He is obese. He is not diaphoretic.     Comments: Well-appearing, comfortable, cooperative  HENT:     Head: Normocephalic and atraumatic.  Eyes:     General:        Right eye: No discharge.        Left eye: No  discharge.     Conjunctiva/sclera: Conjunctivae normal.     Pupils: Pupils are equal, round, and reactive to light.  Neck:     Thyroid: No thyromegaly.  Cardiovascular:     Rate and Rhythm: Normal rate and regular rhythm.     Pulses: Normal pulses.     Heart sounds: Normal heart sounds. No murmur heard. Pulmonary:     Effort: Pulmonary effort is normal. No respiratory distress.     Breath sounds: Normal breath sounds. No wheezing or rales.  Abdominal:     General: Bowel sounds are normal. There is no distension.     Palpations: Abdomen is soft. There is no mass.     Tenderness: There is no abdominal tenderness.  Musculoskeletal:        General: No tenderness. Normal range of motion.     Cervical back: Normal range of motion and neck supple.     Right lower leg: No edema.     Left lower leg: No edema.     Comments: Upper / Lower Extremities: - Normal muscle tone, strength bilateral upper extremities 5/5, lower extremities 5/5  Lymphadenopathy:     Cervical: No cervical adenopathy.  Skin:    General: Skin is warm and dry.     Findings: No erythema or rash.  Neurological:     Mental Status: He is alert and oriented to person, place, and time.     Comments: Distal sensation intact to light touch all extremities  Psychiatric:        Mood and Affect: Mood normal.        Behavior: Behavior normal.        Thought Content: Thought content normal.     Comments: Well groomed, good eye contact, normal speech and thoughts      Results for orders placed or performed in visit on 08/10/20  Hepatic function panel  Result Value Ref Range   Total Protein 6.5 6.0 - 8.5 g/dL   Albumin 4.3 3.6 - 4.6 g/dL   Bilirubin Total 0.3 0.0 - 1.2 mg/dL   Bilirubin, Direct 0.13 0.00 - 0.40 mg/dL   Alkaline Phosphatase 61 44 - 121 IU/L   AST 19 0 - 40 IU/L   ALT 18 0 - 44 IU/L      Assessment & Plan:   Problem List Items Addressed This Visit     OSA on CPAP    Well controlled, chronic OSA on  CPAP - Good adherence to CPAP nightly - Continue current CPAP therapy, patient seems to be benefiting from therapy       Chronic atrial fibrillation    Chronic AFib Now recently advised by Cardiology 2nd opinion to start with Eliquis '5mg'$  BID, initial instructions were daily, asked patient to clarify BID dosing and pursue recommendations from them      Benign hypertension with CKD (chronic kidney disease) stage III (HCC)    Mild elevated BP On Olmesartan '40mg'$  and Metoprolol XL '50mg'$  daily Also followed by Cardiology Dr Lavera Guise      Relevant Orders   COMPLETE METABOLIC PANEL WITH GFR   CBC with Differential/Platelet   Other Visit Diagnoses     Annual physical exam    -  Primary   Abnormal glucose       Relevant Orders   Hemoglobin A1c   Hypertriglyceridemia       Relevant Orders   Lipid panel   Nocturia       Relevant Orders   PSA       Updated Health Maintenance information Fasting labs ordered today, pending Encouraged improvement to lifestyle with diet and exercise Goal of weight loss   Orders Placed This Encounter  Procedures   COMPLETE METABOLIC PANEL WITH GFR   CBC with Differential/Platelet   Lipid panel    Order Specific Question:   Has the patient fasted?    Answer:   Yes   Hemoglobin A1c   PSA     No orders of the defined types were placed in this encounter.    Follow up plan: Return in about 4 months (around 05/26/2022) for 4 month follow-up HTN, AFib updates.  Nobie Putnam, [facility] 01/23/2022, 9:02 AM

## 2022-01-23 NOTE — Patient Instructions (Addendum)
Thank you for coming to the office today.  I agree with Dr Lavera Guise on the Eliquis as a good option for a blood thinner to prevent stroke and heart attack.  Recommend checking in with him to clarify the dosage. He wrote once daily but then the rx sent to the pharmacy says twice daily, but only gave 90 pills which means likely it is back to once daily.  Labs today, stay tuned for results.  Please schedule a Follow-up Appointment to: Return in about 4 months (around 05/26/2022) for 4 month follow-up HTN, AFib updates.  If you have any other questions or concerns, please feel free to call the office or send a message through [place]. You may also schedule an earlier appointment if necessary.  Additionally, you may be receiving a survey about your experience at our office within a few days to 1 week by e-mail or mail. We value your feedback.  Nobie Putnam, DO [place]

## 2022-01-24 LAB — COMPLETE METABOLIC PANEL WITH GFR
AG Ratio: 2.5 (calc) (ref 1.0–2.5)
ALT: 30 U/L (ref 9–46)
AST: 21 U/L (ref 10–35)
Albumin: 4.7 g/dL (ref 3.6–5.1)
Alkaline phosphatase (APISO): 48 U/L (ref 35–144)
BUN/Creatinine Ratio: 17 (calc) (ref 6–22)
BUN: 21 mg/dL (ref 7–25)
CO2: 24 mmol/L (ref 20–32)
Calcium: 9.3 mg/dL (ref 8.6–10.3)
Chloride: 103 mmol/L (ref 98–110)
Creat: 1.24 mg/dL — ABNORMAL HIGH (ref 0.70–1.22)
Globulin: 1.9 g/dL (calc) (ref 1.9–3.7)
Glucose, Bld: 97 mg/dL (ref 65–99)
Potassium: 4.5 mmol/L (ref 3.5–5.3)
Sodium: 139 mmol/L (ref 135–146)
Total Bilirubin: 1.2 mg/dL (ref 0.2–1.2)
Total Protein: 6.6 g/dL (ref 6.1–8.1)
eGFR: 57 mL/min/{1.73_m2} — ABNORMAL LOW (ref >=60)

## 2022-01-24 LAB — HEMOGLOBIN A1C
Hgb A1c MFr Bld: 5.4 % of total Hgb (ref <5.7)
Mean Plasma Glucose: 108 mg/dL
eAG (mmol/L): 6 mmol/L

## 2022-01-24 LAB — CBC WITH DIFFERENTIAL/PLATELET
Absolute Monocytes: 594 cells/uL (ref 200–950)
Basophils Absolute: 22 cells/uL (ref 0–200)
Basophils Relative: 0.4 %
Eosinophils Absolute: 231 cells/uL (ref 15–500)
Eosinophils Relative: 4.2 %
HCT: 44.6 % (ref 38.5–50.0)
Hemoglobin: 15.3 g/dL (ref 13.2–17.1)
Lymphs Abs: 1485 cells/uL (ref 850–3900)
MCH: 36.1 pg — ABNORMAL HIGH (ref 27.0–33.0)
MCHC: 34.3 g/dL (ref 32.0–36.0)
MCV: 105.2 fL — ABNORMAL HIGH (ref 80.0–100.0)
MPV: 8.6 fL (ref 7.5–12.5)
Monocytes Relative: 10.8 %
Neutro Abs: 3168 cells/uL (ref 1500–7800)
Neutrophils Relative %: 57.6 %
Platelets: 191 10*3/uL (ref 140–400)
RBC: 4.24 10*6/uL (ref 4.20–5.80)
RDW: 13.1 % (ref 11.0–15.0)
Total Lymphocyte: 27 %
WBC: 5.5 10*3/uL (ref 3.8–10.8)

## 2022-01-24 LAB — LIPID PANEL
Cholesterol: 167 mg/dL (ref <200)
HDL: 41 mg/dL (ref >=40)
LDL Cholesterol (Calc): 82 mg/dL (calc)
Non-HDL Cholesterol (Calc): 126 mg/dL (calc) (ref <130)
Total CHOL/HDL Ratio: 4.1 (calc) (ref <5.0)
Triglycerides: 368 mg/dL — ABNORMAL HIGH (ref <150)

## 2022-01-24 LAB — PSA: PSA: 0.59 ng/mL (ref <=4.00)

## 2022-02-05 ENCOUNTER — Encounter: Payer: Self-pay | Admitting: Family Medicine

## 2022-02-06 ENCOUNTER — Ambulatory Visit: Payer: Medicare Other | Admitting: Internal Medicine

## 2022-03-15 ENCOUNTER — Other Ambulatory Visit: Payer: Self-pay | Admitting: Family Medicine

## 2022-03-15 DIAGNOSIS — I10 Essential (primary) hypertension: Secondary | ICD-10-CM

## 2022-03-15 NOTE — Telephone Encounter (Signed)
Requested Prescriptions  Pending Prescriptions Disp Refills  . olmesartan (BENICAR) 40 MG tablet [Pharmacy Med Name: OLMESARTAN MEDOXOMIL '40MG'$  TABLETS] 90 tablet 3    Sig: TAKE 1 TABLET(40 MG) BY MOUTH DAILY     Cardiovascular:  Angiotensin Receptor Blockers Failed - 03/15/2022  3:20 AM      Failed - Cr in normal range and within 180 days    Creat  Date Value Ref Range Status  01/23/2022 1.24 (H) 0.70 - 1.22 mg/dL Final         Passed - K in normal range and within 180 days    Potassium  Date Value Ref Range Status  01/23/2022 4.5 3.5 - 5.3 mmol/L Final         Passed - Patient is not pregnant      Passed - Last BP in normal range    BP Readings from Last 1 Encounters:  01/23/22 136/84         Passed - Valid encounter within last 6 months    Recent Outpatient Visits          1 month ago Annual physical exam   [place], DO   5 months ago Psoriatic arthritis [facility])   [place], DO   1 year ago Pain in finger of right hand   [facility] Olin Hauser, DO   1 year ago Annual physical exam   [facility] Olin Hauser, DO   2 years ago Dermatomyositis [facility])   [facility] Olin Hauser, DO      Future Appointments            In 1 month Cletis [place], MD [facility]

## 2022-05-09 ENCOUNTER — Encounter: Payer: Self-pay | Admitting: Internal Medicine

## 2022-05-09 ENCOUNTER — Ambulatory Visit (INDEPENDENT_AMBULATORY_CARE_PROVIDER_SITE_OTHER): Payer: Medicare Other | Admitting: Internal Medicine

## 2022-05-09 VITALS — BP 135/75 | HR 78 | Ht 76.0 in | Wt 263.3 lb

## 2022-05-09 DIAGNOSIS — I739 Peripheral vascular disease, unspecified: Secondary | ICD-10-CM | POA: Diagnosis not present

## 2022-05-09 DIAGNOSIS — G609 Hereditary and idiopathic neuropathy, unspecified: Secondary | ICD-10-CM

## 2022-05-09 DIAGNOSIS — I482 Chronic atrial fibrillation, unspecified: Secondary | ICD-10-CM | POA: Diagnosis not present

## 2022-05-09 DIAGNOSIS — M159 Polyosteoarthritis, unspecified: Secondary | ICD-10-CM | POA: Diagnosis not present

## 2022-05-09 DIAGNOSIS — L4050 Arthropathic psoriasis, unspecified: Secondary | ICD-10-CM | POA: Diagnosis not present

## 2022-05-09 DIAGNOSIS — Z227 Latent tuberculosis: Secondary | ICD-10-CM

## 2022-05-09 DIAGNOSIS — M48061 Spinal stenosis, lumbar region without neurogenic claudication: Secondary | ICD-10-CM | POA: Diagnosis not present

## 2022-05-09 DIAGNOSIS — G4733 Obstructive sleep apnea (adult) (pediatric): Secondary | ICD-10-CM

## 2022-05-09 DIAGNOSIS — N183 Chronic kidney disease, stage 3 unspecified: Secondary | ICD-10-CM

## 2022-05-09 DIAGNOSIS — I129 Hypertensive chronic kidney disease with stage 1 through stage 4 chronic kidney disease, or unspecified chronic kidney disease: Secondary | ICD-10-CM | POA: Diagnosis not present

## 2022-05-09 NOTE — Assessment & Plan Note (Signed)
Under control on medicine no syncope chest is clear abdomen is soft nontender

## 2022-05-09 NOTE — Progress Notes (Signed)
Established Patient Office Visit  Subjective:  Patient ID: Daniel Brandt, male    DOB: 1933-08-15  Age: 88 y.o. MRN: [id number]  CC:  Chief Complaint  Patient presents with   Hypertension    Hypertension    Daniel Brandt presents for check up  Past Medical History:  Diagnosis Date   A-fib [facility])    Arthritis    FH: heart failure 10/04/2016   Former smoker    Heart murmur    Hypertension    Malaria    Stroke ([facility])    TB lung, latent    Completed 4 months of LTBI tx with Rifampin ~09/09/2020    Past Surgical History:  Procedure Laterality Date   COLONOSCOPY  06/12/2016   FRACTURE SURGERY     KNEE REPLACE Bilateral 2017   LUMBAR FUSION  1995   L3 L4    Family History  Problem Relation Age of Onset   Heart disease Mother    Heart disease Father    Gout Sister    Heart attack Son     Social History   Socioeconomic History   Marital status: Married    Spouse name: Daniel Brandt   Number of children: 6   Years of education: Not on file   Highest education level: Master's degree (e.g., MA, MS, MEng, MEd, MSW, MBA)  Occupational History   Occupation: Retired  Tobacco Use   Smoking status: Former    Packs/day: 1.00    Years: 0.50    Total pack years: 0.50    Types: Cigarettes    Quit date: 10/04/1956    Years since quitting: 65.6   Smokeless tobacco: Former   Tobacco comments:    smoking cessation materials not required  Vaping Use   Vaping Use: Never used  Substance and Sexual Activity   Alcohol use: Yes    Alcohol/week: 28.0 standard drinks of alcohol    Types: 28 Shots of liquor per week    Comment: Scotch   Drug use: No   Sexual activity: Not Currently  Other Topics Concern   Not on file  Social History Narrative   Not on file   Social Determinants of Health   Financial Resource Strain: Low Risk  (01/11/2022)   Overall Financial Resource Strain (CARDIA)    Difficulty of Paying Living Expenses: Not hard at all  Food Insecurity: No Food  Insecurity (01/11/2022)   Hunger Vital Sign    Worried About Running Out of Food in the Last Year: Never true    Ran Out of Food in the Last Year: Never true  Transportation Needs: No Transportation Needs (01/11/2022)   PRAPARE - Hydrologist (Medical): No    Lack of Transportation (Non-Medical): No  Physical Activity: Inactive (11/29/2020)   Exercise Vital Sign    Days of Exercise per Week: 0 days    Minutes of Exercise per Session: 0 min  Stress: No Stress Concern Present (01/11/2022)   [place]    Feeling of Stress : Not at all  Social Connections: [place] (01/11/2022)   Social Connection and Isolation Panel [NHANES]    Frequency of Communication with Friends and Family: More than three times a week    Frequency of Social Gatherings with Friends and Family: More than three times a week    Attends Religious Services: More than 4 times per year    Active Member of [employer] or Organizations: Yes  Attends Archivist Meetings: Never    Marital Status: Married  Human resources officer Violence: Not At Risk (01/11/2022)   Humiliation, Afraid, Rape, and Kick questionnaire    Fear of Current or Ex-Partner: No    Emotionally Abused: No    Physically Abused: No    Sexually Abused: No     Current Outpatient Medications:    apixaban (ELIQUIS) 5 MG TABS tablet, Take 1 tablet (5 mg total) by mouth 2 (two) times daily., Disp: 90 tablet, Rfl: 1   aspirin 81 MG tablet, Take 1 tablet (81 mg total) by mouth daily., Disp: 30 tablet, Rfl:    metoprolol succinate (TOPROL-XL) 50 MG 24 hr tablet, Take 1 tablet (50 mg total) by mouth daily. Take with or immediately following a meal., Disp: 90 tablet, Rfl: 3   Multiple Vitamins-Minerals (MULTIVITAMIN WITH MINERALS) tablet, Take 1 tablet by mouth daily., Disp: , Rfl:    NON FORMULARY, CPAP nightly, Disp: , Rfl:    olmesartan (BENICAR) 40 MG tablet, TAKE 1  TABLET(40 MG) BY MOUTH DAILY, Disp: 90 tablet, Rfl: 3   Omega-3 Fatty Acids (FISH OIL) 1000 MG CAPS, Take 3 capsules by mouth 2 (two) times daily., Disp: , Rfl:    triamcinolone 0.1%-Cerave equivalent 1:1 cream mixture, Apply topically 2 (two) times daily as needed., Disp: 454 g, Rfl: 2   vitamin B-12 (CYANOCOBALAMIN) 500 MCG tablet, Take 500 mcg by mouth daily., Disp: , Rfl:    No Known Allergies  ROS Review of Systems  Constitutional: Negative.  Negative for diaphoresis.  HENT: Negative.  Negative for dental problem.   Eyes: Negative.   Respiratory: Negative.  Negative for chest tightness.   Cardiovascular: Negative.   Gastrointestinal: Negative.   Endocrine: Negative.   Genitourinary: Negative.   Musculoskeletal:  Positive for arthralgias and myalgias. Negative for back pain and gait problem.       Arthiritis  Skin: Negative.   Allergic/Immunologic: Negative.   Neurological: Negative.   Hematological: Negative.   Psychiatric/Behavioral: Negative.    All other systems reviewed and are negative.     Objective:    Physical Exam Constitutional:      Appearance: Normal appearance. He is obese.  HENT:     Head: Normocephalic.     Nose: Nose normal.     Mouth/Throat:     Mouth: Mucous membranes are moist.  Cardiovascular:     Rate and Rhythm: Normal rate and regular rhythm.  Pulmonary:     Effort: No respiratory distress.     Breath sounds: No wheezing or rales.  Abdominal:     General: Bowel sounds are normal.     Palpations: Abdomen is soft.  Musculoskeletal:        General: No swelling.     Cervical back: Normal range of motion.  Skin:    Comments: psoriasis  Neurological:     Mental Status: He is alert. Mental status is at baseline.     Sensory: No sensory deficit.     Coordination: Coordination normal.     Gait: Gait normal.     Deep Tendon Reflexes: Reflexes normal.  Psychiatric:        Mood and Affect: Mood normal.        Behavior: Behavior normal.         Thought Content: Thought content normal.        Judgment: Judgment normal.     BP 135/75   Pulse 78   Ht _0  (1.93 m)  Wt 263 lb 4.8 oz (119.4 kg)   BMI 32.05 kg/m  Wt Readings from Last 3 Encounters:  05/09/22 263 lb 4.8 oz (119.4 kg)  01/23/22 261 lb (118.4 kg)  01/22/22 263 lb 4.8 oz (119.4 kg)     Health Maintenance Due  Topic Date Due   COVID-19 Vaccine (3 - Pfizer risk series) 10/04/2019    There are no preventive care reminders to display for this patient.  Lab Results  Component Value Date   TSH 4.77 (H) 04/12/2020   Lab Results  Component Value Date   WBC 5.5 01/23/2022   HGB 15.3 01/23/2022   HCT 44.6 01/23/2022   MCV 105.2 (H) 01/23/2022   PLT 191 01/23/2022   Lab Results  Component Value Date   NA 139 01/23/2022   K 4.5 01/23/2022   CO2 24 01/23/2022   GLUCOSE 97 01/23/2022   BUN 21 01/23/2022   CREATININE 1.24 (H) 01/23/2022   BILITOT 1.2 01/23/2022   ALKPHOS 61 08/10/2020   AST 21 01/23/2022   ALT 30 01/23/2022   PROT 6.6 01/23/2022   ALBUMIN 4.3 08/10/2020   CALCIUM 9.3 01/23/2022   EGFR 57 (L) 01/23/2022   Lab Results  Component Value Date   CHOL 167 01/23/2022   Lab Results  Component Value Date   HDL 41 01/23/2022   Lab Results  Component Value Date   LDLCALC 82 01/23/2022   Lab Results  Component Value Date   TRIG 368 (H) 01/23/2022   Lab Results  Component Value Date   CHOLHDL 4.1 01/23/2022   Lab Results  Component Value Date   HGBA1C 5.4 01/23/2022      Assessment & Plan:   Problem List Items Addressed This Visit       Cardiovascular and Mediastinum   Benign hypertension with CKD (chronic kidney disease) stage III ([place])     Patient denies any chest pain or shortness of breath there is no history of palpitation or paroxysmal nocturnal dyspnea   patient was advised to follow low-salt low-cholesterol diet    ideally I want to keep systolic blood pressure below 130 mmHg, patient was asked to check blood  pressure one times a week and give me a report on that.  Patient will be follow-up in 3 months  or earlier as needed, patient will call me back for any change in the cardiovascular symptoms Patient was advised to buy a book from local bookstore concerning blood pressure and read several chapters  every day.  This will be supplemented by some of the material we will give him from the office.  Patient should also utilize other resources like YouTube and Internet to learn more about the blood pressure and the diet.      Chronic atrial fibrillation - Primary    Under control on medicine no syncope chest is clear abdomen is soft nontender      PVD (peripheral vascular disease) (HCC)    Stable denies any current claudication        Respiratory   OSA on CPAP    Patient uses CPAP regularly he was advised to lose weight      TB lung, latent    He has been treated        Nervous and Auditory   Peripheral neuropathy, idiopathic    Under control at the present time        Musculoskeletal and Integument   Psoriatic arthritis [facility])    Patient is referred to rheumatologist  he need to get his methotrexate from the rheumatologist      DJD (degenerative joint disease)     Other   Spinal stenosis of lumbar region without neurogenic claudication    No history of sciatica       No orders of the defined types were placed in this encounter.   Follow-up: No follow-ups on file.    Cletis Athens, MD

## 2022-05-09 NOTE — Assessment & Plan Note (Signed)
He has been treated

## 2022-05-09 NOTE — Assessment & Plan Note (Signed)
Patient uses CPAP regularly he was advised to lose weight

## 2022-05-09 NOTE — Assessment & Plan Note (Signed)
Patient is referred to rheumatologist he need to get his methotrexate from the rheumatologist

## 2022-05-09 NOTE — Assessment & Plan Note (Signed)
Stable denies any current claudication

## 2022-05-09 NOTE — Assessment & Plan Note (Signed)
No history of sciatica

## 2022-05-09 NOTE — Assessment & Plan Note (Signed)
Under control at the present time 

## 2022-05-09 NOTE — Assessment & Plan Note (Signed)

## 2022-06-18 ENCOUNTER — Ambulatory Visit: Payer: Self-pay

## 2022-06-18 NOTE — Telephone Encounter (Signed)
  Chief Complaint: SOB Symptoms: SOB, cough and congestion  Frequency: 1 week  Pertinent Negatives: Patient denies fever Disposition: '[]'$ ED /'[]'$ Urgent Care (no appt availability in office) / '[x]'$ Appointment(In office/virtual)/ '[]'$  [place] [facility]/ '[]'$ Home Care/ '[]'$ Refused Recommended Disposition /'[]'$ [place] [facility]/ '[]'$  Follow-up with PCP Additional Notes: spoke with pt's wife. States pt been sick about 1 week and not getting better. Has SOB but can lie flat and no SOB at rest. Advised appt tomorrow available, scheduled 06/19/22 @ 1120 with Dr. Raliegh Ip, advised if sx get worse can go to UC today if needed. Wife verbalized understanding.   Reason for Disposition  [1] MILD difficulty breathing (e.g., minimal/no SOB at rest, SOB with walking, pulse <100) AND [2] NEW-onset or WORSE than normal  Answer Assessment - Initial Assessment Questions 1. RESPIRATORY STATUS: "Describe your breathing?" (e.g., wheezing, shortness of breath, unable to speak, severe coughing)      SOB 2. ONSET: "When did this breathing problem begin?"      1 week or so  4. SEVERITY: "How bad is your breathing?" (e.g., mild, moderate, severe)    - MILD: No SOB at rest, mild SOB with walking, speaks normally in sentences, can lie down, no retractions, pulse < 100.    - MODERATE: SOB at rest, SOB with minimal exertion and prefers to sit, cannot lie down flat, speaks in phrases, mild retractions, audible wheezing, pulse 100-120.    - SEVERE: Very SOB at rest, speaks in single words, struggling to breathe, sitting hunched forward, retractions, pulse > 120      Mild  7. LUNG HISTORY: "Do you have any history of lung disease?"  (e.g., pulmonary embolus, asthma, emphysema)     no 9. OTHER SYMPTOMS: "Do you have any other symptoms? (e.g., dizziness, runny nose, cough, chest pain, fever)     Cough and congestion, sinus pressure  Protocols used: Breathing Difficulty-A-AH

## 2022-06-19 ENCOUNTER — Ambulatory Visit (INDEPENDENT_AMBULATORY_CARE_PROVIDER_SITE_OTHER): Payer: Medicare Other | Admitting: Family Medicine

## 2022-06-19 ENCOUNTER — Encounter: Payer: Self-pay | Admitting: Family Medicine

## 2022-06-19 VITALS — BP 138/84 | HR 70 | Ht 76.0 in | Wt 256.0 lb

## 2022-06-19 DIAGNOSIS — J0110 Acute frontal sinusitis, unspecified: Secondary | ICD-10-CM | POA: Diagnosis not present

## 2022-06-19 MED ORDER — AMOXICILLIN 500 MG PO CAPS: 500.0000 mg | ORAL_CAPSULE | Freq: Two times a day (BID) | ORAL | 0 refills | Status: DC

## 2022-06-19 MED ORDER — FLUTICASONE PROPIONATE 50 MCG/ACT NA SUSP: 2.0000 | Freq: Every day | NASAL | 3 refills | Status: DC

## 2022-06-19 NOTE — Progress Notes (Signed)
Subjective:    Patient ID: Daniel Brandt, male    DOB: 07-Apr-1934, 88 y.o.   MRN: [id number]  Daniel Brandt is a 88 y.o. male presenting on 06/19/2022 for Cough and Nasal Congestion  Patient presents for a same day appointment.  HPI  Sinusitis Reports onset 1 week ago approximately Recent sinus congestion drainage, into lower chest respiratory symptoms, bronchitis symptoms with cough that has now significantly improved Today mostly clear in chest and improved overall No sick contacts Denies fever chills dyspnea nausea vomiting      06/19/2022   12:01 PM 01/23/2022    9:09 AM 01/11/2022    9:47 AM  Depression screen PHQ 2/9  Decreased Interest 0 1 0  Down, Depressed, Hopeless  0 1  PHQ - 2 Score 0 1 1  Altered sleeping 0 0 0  Tired, decreased energy _0 Change in appetite 0 0 0  Feeling bad or failure about yourself  0 1 0  Trouble concentrating 0 0 0  Moving slowly or fidgety/restless 0 0 0  Suicidal thoughts 0 0 0  PHQ-9 Score _1 Difficult doing work/chores Somewhat difficult Somewhat difficult Not difficult at all    Social History   Tobacco Use   Smoking status: Former    Packs/day: 1.00    Years: 0.50    Total pack years: 0.50    Types: Cigarettes    Quit date: 10/04/1956    Years since quitting: 65.7   Smokeless tobacco: Former   Tobacco comments:    smoking cessation materials not required  Vaping Use   Vaping Use: Never used  Substance Use Topics   Alcohol use: Yes    Alcohol/week: 28.0 standard drinks of alcohol    Types: 28 Shots of liquor per week    Comment: Scotch   Drug use: No    Review of Systems Per HPI unless specifically indicated above     Objective:    BP (!) 155/87   Pulse 70   Ht _2  (1.93 m)   Wt 256 lb (116.1 kg)   SpO2 99%   BMI 31.16 kg/m   Wt Readings from Last 3 Encounters:  06/19/22 256 lb (116.1 kg)  05/09/22 263 lb 4.8 oz (119.4 kg)  01/23/22 261 lb (118.4 kg)    Physical Exam Vitals and nursing  note reviewed.  Constitutional:      General: He is not in acute distress.    Appearance: He is well-developed. He is not diaphoretic.     Comments: Well-appearing, comfortable, cooperative  HENT:     Head: Normocephalic and atraumatic.  Eyes:     General:        Right eye: No discharge.        Left eye: No discharge.     Conjunctiva/sclera: Conjunctivae normal.  Neck:     Thyroid: No thyromegaly.  Cardiovascular:     Rate and Rhythm: Normal rate and regular rhythm.     Pulses: Normal pulses.     Heart sounds: Normal heart sounds. No murmur heard. Pulmonary:     Effort: Pulmonary effort is normal. No respiratory distress.     Breath sounds: Normal breath sounds. No wheezing or rales.  Musculoskeletal:        General: Normal range of motion.     Cervical back: Normal range of motion and neck supple.  Lymphadenopathy:     Cervical: No cervical adenopathy.  Skin:    General: Skin  is warm and dry.     Findings: No erythema or rash.  Neurological:     Mental Status: He is alert and oriented to person, place, and time. Mental status is at baseline.  Psychiatric:        Behavior: Behavior normal.     Comments: Well groomed, good eye contact, normal speech and thoughts       Results for orders placed or performed in visit on 01/23/22  COMPLETE METABOLIC PANEL WITH GFR  Result Value Ref Range   Glucose, Bld 97 65 - 99 mg/dL   BUN 21 7 - 25 mg/dL   Creat 1.24 (H) 0.70 - 1.22 mg/dL   eGFR 57 (L) > OR = 60 mL/min/1.48m   BUN/Creatinine Ratio 17 6 - 22 (calc)   Sodium 139 135 - 146 mmol/L   Potassium 4.5 3.5 - 5.3 mmol/L   Chloride 103 98 - 110 mmol/L   CO2 24 20 - 32 mmol/L   Calcium 9.3 8.6 - 10.3 mg/dL   Total Protein 6.6 6.1 - 8.1 g/dL   Albumin 4.7 3.6 - 5.1 g/dL   Globulin 1.9 1.9 - 3.7 g/dL (calc)   AG Ratio 2.5 1.0 - 2.5 (calc)   Total Bilirubin 1.2 0.2 - 1.2 mg/dL   Alkaline phosphatase (APISO) 48 35 - 144 U/L   AST 21 10 - 35 U/L   ALT 30 9 - 46 U/L  CBC with  Differential/Platelet  Result Value Ref Range   WBC 5.5 3.8 - 10.8 Thousand/uL   RBC 4.24 4.20 - 5.80 Million/uL   Hemoglobin 15.3 13.2 - 17.1 g/dL   HCT 44.6 38.5 - 50.0 %   MCV 105.2 (H) 80.0 - 100.0 fL   MCH 36.1 (H) 27.0 - 33.0 pg   MCHC 34.3 32.0 - 36.0 g/dL   RDW 13.1 11.0 - 15.0 %   Platelets 191 140 - 400 Thousand/uL   MPV 8.6 7.5 - 12.5 fL   Neutro Abs 3,168 1,500 - 7,800 cells/uL   Lymphs Abs 1,485 850 - 3,900 cells/uL   Absolute Monocytes 594 200 - 950 cells/uL   Eosinophils Absolute 231 15 - 500 cells/uL   Basophils Absolute 22 0 - 200 cells/uL   Neutrophils Relative % 57.6 %   Total Lymphocyte 27.0 %   Monocytes Relative 10.8 %   Eosinophils Relative 4.2 %   Basophils Relative 0.4 %  Lipid panel  Result Value Ref Range   Cholesterol 167 <200 mg/dL   HDL 41 > OR = 40 mg/dL   Triglycerides 368 (H) <150 mg/dL   LDL Cholesterol (Calc) 82 mg/dL (calc)   Total CHOL/HDL Ratio 4.1 <5.0 (calc)   Non-HDL Cholesterol (Calc) 126 <130 mg/dL (calc)  Hemoglobin A1c  Result Value Ref Range   Hgb A1c MFr Bld 5.4 <5.7 % of total Hgb   Mean Plasma Glucose 108 mg/dL   eAG (mmol/L) 6.0 mmol/L  PSA  Result Value Ref Range   PSA 0.59 < OR = 4.00 ng/mL      Assessment & Plan:   Problem List Items Addressed This Visit   None Visit Diagnoses     Acute non-recurrent frontal sinusitis    -  Primary   Relevant Medications   fluticasone (FLONASE) 50 MCG/ACT nasal spray   amoxicillin (AMOXIL) 500 MG capsule       Consistent with acute frontal rhinosinusitis, likely initially viral URI vs allergic rhinitis component without evidence of persistent bacterial infection.  Plan: 1. Reassurance, likely  self-limited - no indication for antibiotics at this time 2. Start nasal steroid Flonase 2 sprays in each nostril daily for 4-6 weeks, may repeat course seasonally or as needed 3. OTC meds as needed 4. Printed empiric Amoxicillin BACK UP PLAN ONLY if worse / notify office Return  criteria reviewed   Meds ordered this encounter  Medications   fluticasone (FLONASE) 50 MCG/ACT nasal spray    Sig: Place 2 sprays into both nostrils daily. Use for 4-6 weeks then stop and use seasonally or as needed.    Dispense:  16 g    Refill:  3   amoxicillin (AMOXIL) 500 MG capsule    Sig: Take 1 capsule (500 mg total) by mouth 2 (two) times daily. For 7 days    Dispense:  14 capsule    Refill:  0      Follow up plan: Return if symptoms worsen or fail to improve.   Nobie Putnam, [facility] 06/19/2022, 12:02 PM

## 2022-06-19 NOTE — Patient Instructions (Addendum)
Thank you for coming to the office today.  Likely viral syndrome then improved Sinusitis allergy congestion likely now Start nasal steroid Flonase 2 sprays in each nostril daily for 4-6 weeks, may repeat course seasonally or as needed  Printed Amoxicillin as back up plan only if worsening again.  Please schedule a Follow-up Appointment to: Return if symptoms worsen or fail to improve.  If you have any other questions or concerns, please feel free to call the office or send a message through [place]. You may also schedule an earlier appointment if necessary.  Additionally, you may be receiving a survey about your experience at our office within a few days to 1 week by e-mail or mail. We value your feedback.  Nobie Putnam, DO [place]

## 2022-08-15 ENCOUNTER — Ambulatory Visit: Payer: Medicare Other | Admitting: Internal Medicine

## 2022-08-25 IMAGING — DX DG FINGER INDEX 2+V*R*
3 series · 3 of 3 positions shown · non-contrast
Comparison: None.

CLINICAL DATA: Dog bite

EXAM:
RIGHT SECOND FINGER 2+V

[finger ap]
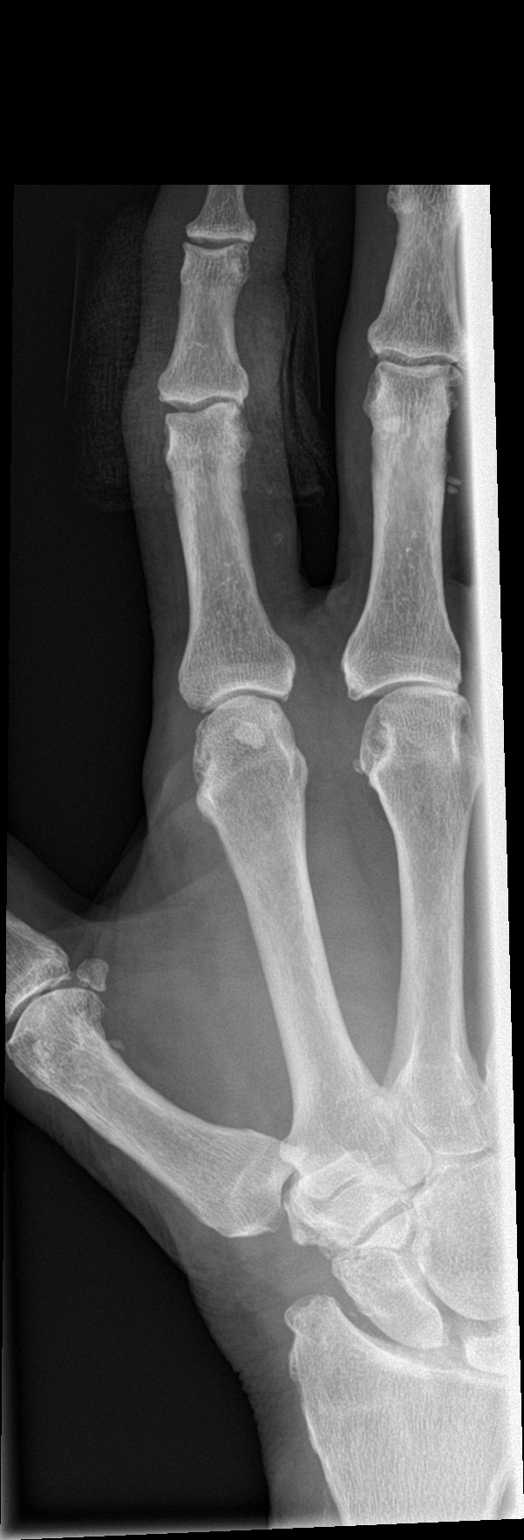

[finger obl]
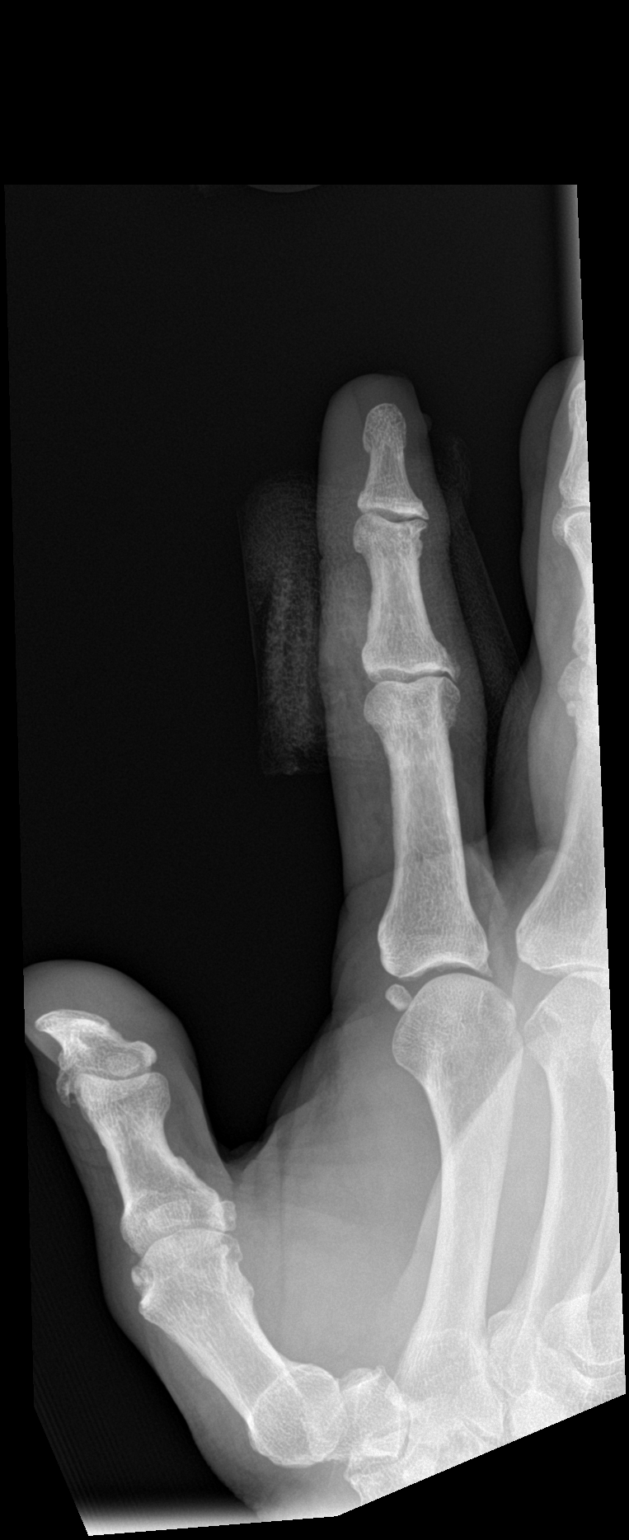

[finger lat]
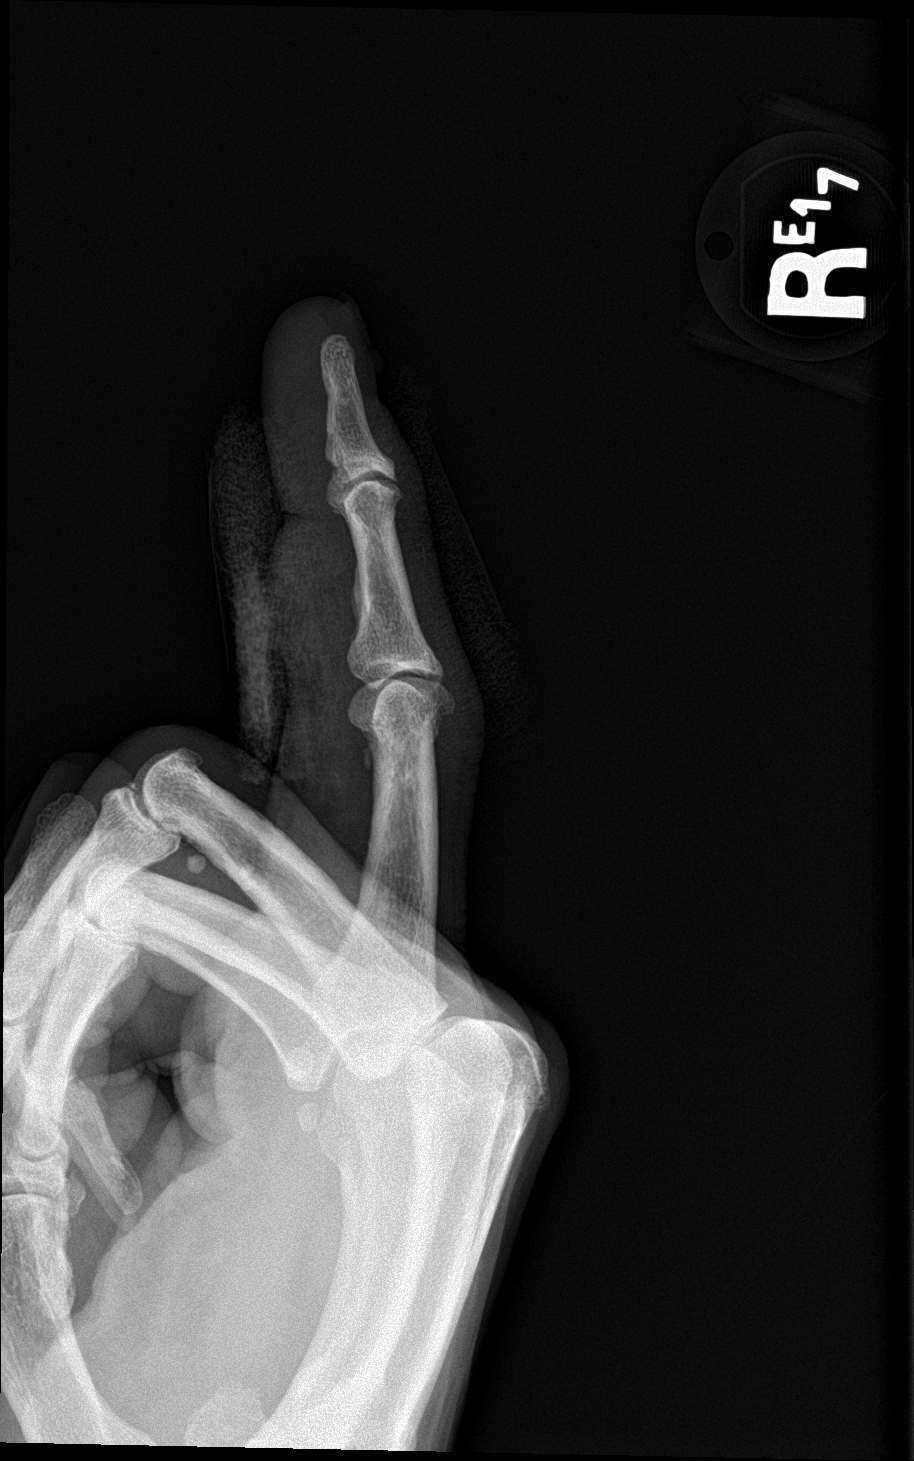

[3 of 3 positions shown; findings below may reference images not displayed]

FINDINGS: Frontal, oblique, and lateral views obtained there is soft tissue
swelling in the second digit, primarily in the region of the PIP
joint. There is an overlying bandage. No other radiopaque foreign
body. No soft tissue air appreciable. No fracture or dislocation.
Osteoarthritic changes noted in the second MCP, PIP, and DIP joints.
No erosive change.
IMPRESSION: Soft tissue swelling with overlying bandage. No other radiopaque
foreign body beyond the bandage. No soft tissue air.

No fracture or dislocation. Osteoarthritic change noted in second
MCP, PIP, and DIP joints.

## 2022-10-08 ENCOUNTER — Other Ambulatory Visit: Payer: Self-pay | Admitting: Family Medicine

## 2022-10-08 DIAGNOSIS — I129 Hypertensive chronic kidney disease with stage 1 through stage 4 chronic kidney disease, or unspecified chronic kidney disease: Secondary | ICD-10-CM

## 2022-10-08 DIAGNOSIS — I482 Chronic atrial fibrillation, unspecified: Secondary | ICD-10-CM

## 2022-10-08 NOTE — Telephone Encounter (Signed)
Requested Prescriptions  Pending Prescriptions Disp Refills   metoprolol succinate (TOPROL-XL) 50 MG 24 hr tablet [Pharmacy Med Name: METOPROLOL ER SUCCINATE 50MG  TABS] 90 tablet 0    Sig: TAKE 1 TABLET(50 MG) BY MOUTH DAILY WITH OR IMMEDIATELY FOLLOWING A MEAL     Cardiovascular:  Beta Blockers Failed - 10/08/2022  3:19 AM      Failed - Valid encounter within last 6 months    Recent Outpatient Visits           3 months ago Acute non-recurrent frontal sinusitis   [facility] Olin Hauser, DO   8 months ago Annual physical exam   [facility] Olin Hauser, DO   12 months ago Psoriatic arthritis [facility])   [place], DO   1 year ago Pain in finger of right hand   [place], DO   2 years ago Annual physical exam   [place], DO              Passed - Last BP in normal range    BP Readings from Last 1 Encounters:  06/19/22 138/84         Passed - Last Heart Rate in normal range    Pulse Readings from Last 1 Encounters:  06/19/22 70

## 2023-01-08 ENCOUNTER — Ambulatory Visit (INDEPENDENT_AMBULATORY_CARE_PROVIDER_SITE_OTHER): Payer: Medicare Other | Admitting: Family Medicine

## 2023-01-08 ENCOUNTER — Encounter: Payer: Self-pay | Admitting: Family Medicine

## 2023-01-08 VITALS — BP 136/80 | HR 73 | Temp 98.5°F | Resp 18 | Ht 76.0 in | Wt 253.6 lb

## 2023-01-08 DIAGNOSIS — G4733 Obstructive sleep apnea (adult) (pediatric): Secondary | ICD-10-CM

## 2023-01-08 DIAGNOSIS — I482 Chronic atrial fibrillation, unspecified: Secondary | ICD-10-CM

## 2023-01-08 DIAGNOSIS — M2061 Acquired deformities of toe(s), unspecified, right foot: Secondary | ICD-10-CM

## 2023-01-08 DIAGNOSIS — R5383 Other fatigue: Secondary | ICD-10-CM

## 2023-01-08 NOTE — Progress Notes (Unsigned)
Subjective:    Patient ID: Daniel Brandt, male    DOB: 1933-11-28, 89 y.o.   MRN: [id number]  Daniel Brandt is a 89 y.o. male presenting on 01/08/2023 for Joint Swelling (Second toe swollen on Rt foot  in the joint x 2-3 weeks. Red and inflamed. Pt denies pain or injuring the toe. )   HPI  *** 2nd Toe Swelling Deformity / Right Foot  Osteoarthritis Onset 2-3 weeks, unsure precisely Not painful has neuropathy ***Check Uric Acid  Fatigue *** He does not endorse as much  History of Mini Strokes in past for 40 yrs. None lately *** Chronic Atrial Fibrillation   OSA on CPAP - Patient reports prior history of dx OSA and on CPAP - Today reports that sleep apnea is well controlled. He uses the CPAP machine every night. Tolerates the machine well, and thinks that sleeps better with it and feels good. No new concerns or symptoms. He will take up with nocturia, and he can fall back asleep   Alcohol Dependence ***  Health Maintenance: ***     01/08/2023   10:41 AM 06/19/2022   12:01 PM 01/23/2022    9:09 AM  Depression screen PHQ 2/9  Decreased Interest 0 0 1  Down, Depressed, Hopeless 0  0  PHQ - 2 Score 0 0 1  Altered sleeping 0 0 0  Tired, decreased energy 1 3 1   Change in appetite 1 0 0  Feeling bad or failure about yourself  0 0 1  Trouble concentrating 0 0 0  Moving slowly or fidgety/restless 0 0 0  Suicidal thoughts 0 0 0  PHQ-9 Score 2 3 3   Difficult doing work/chores Not difficult at all Somewhat difficult Somewhat difficult    Social History   Tobacco Use   Smoking status: Former    Packs/day: 1.00    Years: 0.50    Additional pack years: 0.00    Total pack years: 0.50    Types: Cigarettes    Quit date: 10/04/1956    Years since quitting: 66.3   Smokeless tobacco: Former   Tobacco comments:    smoking cessation materials not required  Vaping Use   Vaping Use: Never used  Substance Use Topics   Alcohol use: Yes    Alcohol/week: 28.0 standard  drinks of alcohol    Types: 28 Shots of liquor per week    Comment: Scotch   Drug use: No    Review of Systems Per HPI unless specifically indicated above     Objective:    BP 136/80 (BP Location: Left Arm, Patient Position: Sitting, Cuff Size: Large)   Pulse 73   Temp 98.5 F (36.9 C) (Oral)   Resp 18   Ht 6\' 4"  (1.93 m)   Wt 253 lb 9.6 oz (115 kg)   SpO2 98%   BMI 30.87 kg/m   Wt Readings from Last 3 Encounters:  01/08/23 253 lb 9.6 oz (115 kg)  06/19/22 256 lb (116.1 kg)  05/09/22 263 lb 4.8 oz (119.4 kg)    Physical Exam    Results for orders placed or performed in visit on 01/23/22  COMPLETE METABOLIC PANEL WITH GFR  Result Value Ref Range   Glucose, Bld 97 65 - 99 mg/dL   BUN 21 7 - 25 mg/dL   Creat 8.65 (H) 7.84 - 1.22 mg/dL   eGFR 57 (L) > OR = 60 mL/min/1.77m2   BUN/Creatinine Ratio 17 6 - 22 (calc)   Sodium 139 135 -  146 mmol/L   Potassium 4.5 3.5 - 5.3 mmol/L   Chloride 103 98 - 110 mmol/L   CO2 24 20 - 32 mmol/L   Calcium 9.3 8.6 - 10.3 mg/dL   Total Protein 6.6 6.1 - 8.1 g/dL   Albumin 4.7 3.6 - 5.1 g/dL   Globulin 1.9 1.9 - 3.7 g/dL (calc)   AG Ratio 2.5 1.0 - 2.5 (calc)   Total Bilirubin 1.2 0.2 - 1.2 mg/dL   Alkaline phosphatase (APISO) 48 35 - 144 U/L   AST 21 10 - 35 U/L   ALT 30 9 - 46 U/L  CBC with Differential/Platelet  Result Value Ref Range   WBC 5.5 3.8 - 10.8 Thousand/uL   RBC 4.24 4.20 - 5.80 Million/uL   Hemoglobin 15.3 13.2 - 17.1 g/dL   HCT 86.5 78.4 - 69.6 %   MCV 105.2 (H) 80.0 - 100.0 fL   MCH 36.1 (H) 27.0 - 33.0 pg   MCHC 34.3 32.0 - 36.0 g/dL   RDW 29.5 28.4 - 13.2 %   Platelets 191 140 - 400 Thousand/uL   MPV 8.6 7.5 - 12.5 fL   Neutro Abs 3,168 1,500 - 7,800 cells/uL   Lymphs Abs 1,485 850 - 3,900 cells/uL   Absolute Monocytes 594 200 - 950 cells/uL   Eosinophils Absolute 231 15 - 500 cells/uL   Basophils Absolute 22 0 - 200 cells/uL   Neutrophils Relative % 57.6 %   Total Lymphocyte 27.0 %   Monocytes  Relative 10.8 %   Eosinophils Relative 4.2 %   Basophils Relative 0.4 %  Lipid panel  Result Value Ref Range   Cholesterol 167 <200 mg/dL   HDL 41 > OR = 40 mg/dL   Triglycerides 440 (H) <150 mg/dL   LDL Cholesterol (Calc) 82 mg/dL (calc)   Total CHOL/HDL Ratio 4.1 <5.0 (calc)   Non-HDL Cholesterol (Calc) 126 <130 mg/dL (calc)  Hemoglobin N0U  Result Value Ref Range   Hgb A1c MFr Bld 5.4 <5.7 % of total Hgb   Mean Plasma Glucose 108 mg/dL   eAG (mmol/L) 6.0 mmol/L  PSA  Result Value Ref Range   PSA 0.59 < OR = 4.00 ng/mL      Assessment & Plan:   Problem List Items Addressed This Visit   None   No orders of the defined types were placed in this encounter.     Follow up plan: No follow-ups on file.  Future labs ordered for ***  ***Reschedule physical and labs Uric Acid, Vitamin D, B12, TSH  Saralyn Pilar, DO [facility] 01/08/2023, 10:47 AM

## 2023-01-08 NOTE — Patient Instructions (Addendum)
Thank you for coming to the office today.  We discussed the fatigue I will check variety of tests with upcoming blood work  I do advise reducing alcohol consumption if possible Caution with the fluid intake right before bed, as long as you are sleeping well and not waking up to urinate it is okay.  The Toe seems to be bulky and deformity - may have had history of uric acid or gout. We will check into it. But it is should be Arthritis.  DUE for FASTING BLOOD WORK (no food or drink after midnight before the lab appointment, only water or coffee without cream/sugar on the morning of)  SCHEDULE "Lab Only" visit in the morning at the clinic for lab draw in 4-6 weeks  - Make sure Lab Only appointment is at about 1 week before your next appointment, so that results will be available  For Lab Results, once available within 2-3 days of blood draw, you can can log in to MyChart online to view your results and a brief explanation. Also, we can discuss results at next follow-up visit.    Please schedule a Follow-up Appointment to: Return in about 6 weeks (around 02/19/2023) for 4-6 weeks fasting lab then 1 week later Annual Physical.  If you have any other questions or concerns, please feel free to call the office or send a message through MyChart. You may also schedule an earlier appointment if necessary.  Additionally, you may be receiving a survey about your experience at our office within a few days to 1 week by e-mail or mail. We value your feedback.  Saralyn Pilar, DO [facility], [place]

## 2023-01-09 ENCOUNTER — Other Ambulatory Visit: Payer: Self-pay | Admitting: Family Medicine

## 2023-01-09 ENCOUNTER — Encounter: Payer: Self-pay | Admitting: Family Medicine

## 2023-01-09 DIAGNOSIS — R351 Nocturia: Secondary | ICD-10-CM

## 2023-01-09 DIAGNOSIS — R7309 Other abnormal glucose: Secondary | ICD-10-CM

## 2023-01-09 DIAGNOSIS — E781 Pure hyperglyceridemia: Secondary | ICD-10-CM

## 2023-01-09 DIAGNOSIS — N183 Chronic kidney disease, stage 3 unspecified: Secondary | ICD-10-CM

## 2023-01-09 DIAGNOSIS — E538 Deficiency of other specified B group vitamins: Secondary | ICD-10-CM

## 2023-01-09 DIAGNOSIS — E559 Vitamin D deficiency, unspecified: Secondary | ICD-10-CM

## 2023-01-09 DIAGNOSIS — Z0000 Encounter for general adult medical examination without abnormal findings: Secondary | ICD-10-CM

## 2023-01-09 DIAGNOSIS — M2061 Acquired deformities of toe(s), unspecified, right foot: Secondary | ICD-10-CM

## 2023-01-09 DIAGNOSIS — L4050 Arthropathic psoriasis, unspecified: Secondary | ICD-10-CM

## 2023-01-09 NOTE — Assessment & Plan Note (Signed)
Well controlled, chronic OSA on CPAP - Good adherence to CPAP nightly - Continue current CPAP therapy, patient seems to be benefiting from therapy  

## 2023-01-13 ENCOUNTER — Other Ambulatory Visit: Payer: Self-pay | Admitting: Family Medicine

## 2023-01-13 DIAGNOSIS — I129 Hypertensive chronic kidney disease with stage 1 through stage 4 chronic kidney disease, or unspecified chronic kidney disease: Secondary | ICD-10-CM

## 2023-01-13 DIAGNOSIS — I482 Chronic atrial fibrillation, unspecified: Secondary | ICD-10-CM

## 2023-01-14 NOTE — Telephone Encounter (Signed)
Requested Prescriptions  Pending Prescriptions Disp Refills   metoprolol succinate (TOPROL-XL) 50 MG 24 hr tablet [Pharmacy Med Name: METOPROLOL ER SUCCINATE 50MG  TABS] 90 tablet 0    Sig: TAKE 1 TABLET(50 MG) BY MOUTH DAILY WITH OR IMMEDIATELY FOLLOWING A MEAL     Cardiovascular:  Beta Blockers Passed - 01/13/2023  3:20 AM      Passed - Last BP in normal range    BP Readings from Last 1 Encounters:  01/08/23 136/80         Passed - Last Heart Rate in normal range    Pulse Readings from Last 1 Encounters:  01/08/23 73         Passed - Valid encounter within last 6 months    Recent Outpatient Visits           6 days ago Toe deformity, right   [place] [facility] [place], Netta Neat, DO   6 months ago Acute non-recurrent frontal sinusitis   [place] [facility] Smitty Cords, DO   11 months ago Annual physical exam   [place] [facility] Smitty Cords, DO   1 year ago Psoriatic arthritis [facility])   [place] [facility] [place], Netta Neat, DO   2 years ago Pain in finger of right hand   [place] [facility] [place], Netta Neat, DO       Future Appointments             In 1 month Althea Charon, Netta Neat, DO [place] [facility], [facility]

## 2023-02-15 ENCOUNTER — Other Ambulatory Visit: Payer: Medicare Other

## 2023-02-15 DIAGNOSIS — R7989 Other specified abnormal findings of blood chemistry: Secondary | ICD-10-CM | POA: Diagnosis not present

## 2023-02-15 DIAGNOSIS — E559 Vitamin D deficiency, unspecified: Secondary | ICD-10-CM | POA: Diagnosis not present

## 2023-02-15 DIAGNOSIS — M2061 Acquired deformities of toe(s), unspecified, right foot: Secondary | ICD-10-CM

## 2023-02-15 DIAGNOSIS — N183 Chronic kidney disease, stage 3 unspecified: Secondary | ICD-10-CM | POA: Diagnosis not present

## 2023-02-15 DIAGNOSIS — E538 Deficiency of other specified B group vitamins: Secondary | ICD-10-CM

## 2023-02-15 DIAGNOSIS — I129 Hypertensive chronic kidney disease with stage 1 through stage 4 chronic kidney disease, or unspecified chronic kidney disease: Secondary | ICD-10-CM

## 2023-02-15 DIAGNOSIS — R7309 Other abnormal glucose: Secondary | ICD-10-CM

## 2023-02-15 DIAGNOSIS — Z0000 Encounter for general adult medical examination without abnormal findings: Secondary | ICD-10-CM

## 2023-02-15 DIAGNOSIS — R351 Nocturia: Secondary | ICD-10-CM

## 2023-02-15 DIAGNOSIS — E781 Pure hyperglyceridemia: Secondary | ICD-10-CM

## 2023-02-20 ENCOUNTER — Encounter: Payer: Self-pay | Admitting: Family Medicine

## 2023-02-20 ENCOUNTER — Ambulatory Visit (INDEPENDENT_AMBULATORY_CARE_PROVIDER_SITE_OTHER): Payer: Medicare Other | Admitting: Family Medicine

## 2023-02-20 ENCOUNTER — Other Ambulatory Visit: Payer: Self-pay | Admitting: Family Medicine

## 2023-02-20 VITALS — BP 117/58 | HR 88 | Resp 16 | Ht 76.0 in | Wt 257.8 lb

## 2023-02-20 DIAGNOSIS — R7989 Other specified abnormal findings of blood chemistry: Secondary | ICD-10-CM

## 2023-02-20 DIAGNOSIS — I739 Peripheral vascular disease, unspecified: Secondary | ICD-10-CM | POA: Diagnosis not present

## 2023-02-20 DIAGNOSIS — N183 Chronic kidney disease, stage 3 unspecified: Secondary | ICD-10-CM | POA: Diagnosis not present

## 2023-02-20 DIAGNOSIS — I129 Hypertensive chronic kidney disease with stage 1 through stage 4 chronic kidney disease, or unspecified chronic kidney disease: Secondary | ICD-10-CM

## 2023-02-20 DIAGNOSIS — R7309 Other abnormal glucose: Secondary | ICD-10-CM

## 2023-02-20 DIAGNOSIS — I482 Chronic atrial fibrillation, unspecified: Secondary | ICD-10-CM

## 2023-02-20 DIAGNOSIS — G4733 Obstructive sleep apnea (adult) (pediatric): Secondary | ICD-10-CM

## 2023-02-20 DIAGNOSIS — I10 Essential (primary) hypertension: Secondary | ICD-10-CM

## 2023-02-20 DIAGNOSIS — E790 Hyperuricemia without signs of inflammatory arthritis and tophaceous disease: Secondary | ICD-10-CM

## 2023-02-20 DIAGNOSIS — M170 Bilateral primary osteoarthritis of knee: Secondary | ICD-10-CM | POA: Diagnosis not present

## 2023-02-20 DIAGNOSIS — Z0000 Encounter for general adult medical examination without abnormal findings: Secondary | ICD-10-CM | POA: Diagnosis not present

## 2023-02-20 DIAGNOSIS — L4050 Arthropathic psoriasis, unspecified: Secondary | ICD-10-CM | POA: Diagnosis not present

## 2023-02-20 DIAGNOSIS — E781 Pure hyperglyceridemia: Secondary | ICD-10-CM

## 2023-02-20 MED ORDER — OLMESARTAN MEDOXOMIL 40 MG PO TABS: 40.0000 mg | ORAL_TABLET | Freq: Every day | ORAL | 3 refills | Status: DC

## 2023-02-20 MED ORDER — METOPROLOL SUCCINATE ER 50 MG PO TB24: 50.0000 mg | ORAL_TABLET | Freq: Every day | ORAL | 3 refills | Status: DC

## 2023-02-20 NOTE — Progress Notes (Signed)
Subjective:    Patient ID: Daniel Brandt, male    DOB: [birth year], 89 y.o.   MRN: [id number]  Dilan Netro is a 89 y.o. male presenting on 02/20/2023 for Annual Exam   HPI  Here for Annual Physical and lab Reivew  OSA on CPAP - Patient reports prior history of dx OSA and on CPAP - Today reports that sleep apnea is well controlled. He uses the CPAP machine every night. Tolerates the machine well, and thinks that sleeps better with it and feels good. No new concerns or symptoms. He will take up with nocturia, and he can fall back asleep  Osteoarthritis, multiple joints 2nd Toe Swelling Deformity / Right Foot  Onset 2-3 weeks, unsure precisely when. Not painful has neuropathy Recent Uric Acid level 9.2 No pain or prior history of gout   Elevated TSH 6.02, prior 4.7 to 5.4 Add Free T4 lab, never on thyroid med before Fatigue may be related, metabolism can be affected  Elevated A1c 5.7, prior 5.4 range This is early pre diabetes He does drink quite a bit of sweet tea regularly.  Hypertension On Olmesartan 40mg  and Metoprolol XL 50mg  daily  Hypertriglyceride Elevated TG 356 Due to sugar intake  Vitamin B12 and D normal range.   History of Mini Strokes in past for 40 yrs. None lately   Chronic Atrial Fibrillation Off anticoagulation, previously on this and he prefers to be off medication  Health Maintenance: PSA 0.64 negative     02/20/2023    7:54 AM 01/08/2023   10:41 AM 06/19/2022   12:01 PM  Depression screen PHQ 2/9  Decreased Interest 0 0 0  Down, Depressed, Hopeless 0 0   PHQ - 2 Score 0 0 0  Altered sleeping 0 0 0  Tired, decreased energy 0 1 3  Change in appetite 0 1 0  Feeling bad or failure about yourself  1 0 0  Trouble concentrating 0 0 0  Moving slowly or fidgety/restless 0 0 0  Suicidal thoughts 0 0 0  PHQ-9 Score 1 2 3   Difficult doing work/chores Not difficult at all Not difficult at all Somewhat difficult      02/20/2023    8:00 AM  01/08/2023   10:42 AM 06/19/2022   12:01 PM 01/23/2022    9:09 AM  GAD 7 : Generalized Anxiety Score  Nervous, Anxious, on Edge 0 0 0 0  Control/stop worrying 0 0 0 0  Worry too much - different things 0 0 0 1  Trouble relaxing 0 0 0 0  Restless 0 0 0 0  Easily annoyed or irritable 0 0 0 1  Afraid - awful might happen 0 0 3 1  Total GAD 7 Score 0 0 3 3  Anxiety Difficulty Not difficult at all Not difficult at all Somewhat difficult Somewhat difficult      Past Medical History:  Diagnosis Date   A-fib [facility])    Arthritis    FH: heart failure 10/04/2016   Former smoker    Heart murmur    Hypertension    Malaria    Stroke [facility])    TB lung, latent    Completed 4 months of LTBI tx with Rifampin ~09/09/2020   Past Surgical History:  Procedure Laterality Date   COLONOSCOPY  06/12/2016   FRACTURE SURGERY     KNEE REPLACE Bilateral 2017   LUMBAR FUSION  1995   L3 L4   Social History   Socioeconomic History   Marital  status: Married    Spouse name: Hamer Barbush   Number of children: 6   Years of education: Not on file   Highest education level: Master's degree (e.g., MA, MS, MEng, MEd, MSW, MBA)  Occupational History   Occupation: Retired  Tobacco Use   Smoking status: Former    Current packs/day: 0.00    Average packs/day: 1 pack/day for 0.5 years (0.5 ttl pk-yrs)    Types: Cigarettes    Start date: 04/05/1956    Quit date: 10/04/1956    Years since quitting: 66.4   Smokeless tobacco: Former   Tobacco comments:    smoking cessation materials not required  Vaping Use   Vaping status: Never Used  Substance and Sexual Activity   Alcohol use: Yes    Alcohol/week: 28.0 standard drinks of alcohol    Types: 28 Shots of liquor per week    Comment: Scotch   Drug use: No   Sexual activity: Not Currently  Other Topics Concern   Not on file  Social History Narrative   Not on file   Social Determinants of Health   Financial Resource Strain: Low Risk  (01/04/2023)    Overall Financial Resource Strain (CARDIA)    Difficulty of Paying Living Expenses: Not hard at all  Food Insecurity: No Food Insecurity (02/20/2023)   Hunger Vital Sign    Worried About Running Out of Food in the Last Year: Never true    Ran Out of Food in the Last Year: Never true  Transportation Needs: No Transportation Needs (02/20/2023)   PRAPARE - Administrator, Civil Service (Medical): No    Lack of Transportation (Non-Medical): No  Physical Activity: Unknown (01/04/2023)   Exercise Vital Sign    Days of Exercise per Week: 0 days    Minutes of Exercise per Session: Not on file  Stress: No Stress Concern Present (02/20/2023)   [employer] of Occupational Health - Occupational Stress Questionnaire    Feeling of Stress : Only a little  Social Connections: Socially Integrated (01/04/2023)   Social Connection and Isolation Panel [NHANES]    Frequency of Communication with Friends and Family: More than three times a week    Frequency of Social Gatherings with Friends and Family: Twice a week    Attends Religious Services: 1 to 4 times per year    Active Member of [employer] or Organizations: Yes    Attends Engineer, structural: More than 4 times per year    Marital Status: Married  Catering manager Violence: Not At Risk (02/20/2023)   Humiliation, Afraid, Rape, and Kick questionnaire    Fear of Current or Ex-Partner: No    Emotionally Abused: No    Physically Abused: No    Sexually Abused: No   Family History  Problem Relation Age of Onset   Heart disease Mother    Heart disease Father    Gout Sister    Heart attack Son    Current Outpatient Medications on File Prior to Visit  Medication Sig   aspirin 81 MG tablet Take 1 tablet (81 mg total) by mouth daily.   Multiple Vitamins-Minerals (MULTIVITAMIN WITH MINERALS) tablet Take 1 tablet by mouth daily.   NON FORMULARY CPAP nightly   Omega-3 Fatty Acids (FISH OIL) 1000 MG CAPS Take 3 capsules by mouth 2 (two)  times daily.   triamcinolone 0.1%-Cerave equivalent 1:1 cream mixture Apply topically 2 (two) times daily as needed.   vitamin B-12 (CYANOCOBALAMIN) 500 MCG tablet  Take 500 mcg by mouth daily.   fluticasone (FLONASE) 50 MCG/ACT nasal spray Place 2 sprays into both nostrils daily. Use for 4-6 weeks then stop and use seasonally or as needed. (Patient not taking: Reported on 01/08/2023)   No current facility-administered medications on file prior to visit.    Review of Systems  Constitutional:  Negative for activity change, appetite change, chills, diaphoresis, fatigue and fever.  HENT:  Negative for congestion and hearing loss.   Eyes:  Negative for visual disturbance.  Respiratory:  Negative for cough, chest tightness, shortness of breath and wheezing.   Cardiovascular:  Negative for chest pain, palpitations and leg swelling.  Gastrointestinal:  Negative for abdominal pain, constipation, diarrhea, nausea and vomiting.  Genitourinary:  Negative for dysuria, frequency and hematuria.  Musculoskeletal:  Negative for arthralgias and neck pain.  Skin:  Negative for rash.  Neurological:  Negative for dizziness, weakness, light-headedness, numbness and headaches.  Hematological:  Negative for adenopathy.  Psychiatric/Behavioral:  Negative for behavioral problems, dysphoric mood and sleep disturbance.    Per HPI unless specifically indicated above      Objective:    BP (!) 117/58   Pulse 88   Resp 16   Ht 6\' 4"  (1.93 m)   Wt 257 lb 12.8 oz (116.9 kg)   SpO2 98%   BMI 31.38 kg/m   Wt Readings from Last 3 Encounters:  02/20/23 257 lb 12.8 oz (116.9 kg)  01/08/23 253 lb 9.6 oz (115 kg)  06/19/22 256 lb (116.1 kg)    Physical Exam Vitals and nursing note reviewed.  Constitutional:      General: He is not in acute distress.    Appearance: He is well-developed. He is not diaphoretic.     Comments: Well-appearing, comfortable, cooperative  HENT:     Head: Normocephalic and atraumatic.   Eyes:     General:        Right eye: No discharge.        Left eye: No discharge.     Conjunctiva/sclera: Conjunctivae normal.     Pupils: Pupils are equal, round, and reactive to light.  Neck:     Thyroid: No thyromegaly.     Vascular: No carotid bruit.  Cardiovascular:     Rate and Rhythm: Normal rate. Rhythm irregular.     Pulses: Normal pulses.     Heart sounds: Normal heart sounds. No murmur heard. Pulmonary:     Effort: Pulmonary effort is normal. No respiratory distress.     Breath sounds: Normal breath sounds. No wheezing or rales.  Abdominal:     General: Bowel sounds are normal. There is no distension.     Palpations: Abdomen is soft. There is no mass.     Tenderness: There is no abdominal tenderness.  Musculoskeletal:        General: No tenderness. Normal range of motion.     Cervical back: Normal range of motion and neck supple.     Right lower leg: No edema.     Left lower leg: No edema.     Comments: Upper / Lower Extremities: - Normal muscle tone, strength bilateral upper extremities 5/5, lower extremities 5/5  Lymphadenopathy:     Cervical: No cervical adenopathy.  Skin:    General: Skin is warm and dry.     Findings: No erythema or rash.  Neurological:     Mental Status: He is alert and oriented to person, place, and time.     Comments: Distal sensation intact  to light touch all extremities  Psychiatric:        Mood and Affect: Mood normal.        Behavior: Behavior normal.        Thought Content: Thought content normal.     Comments: Well groomed, good eye contact, normal speech and thoughts       Results for orders placed or performed in visit on 02/15/23  Vitamin B12  Result Value Ref Range   Vitamin B-12 459 200 - 1,100 pg/mL  VITAMIN D 25 Hydroxy (Vit-D Deficiency, Fractures)  Result Value Ref Range   Vit D, 25-Hydroxy 57 30 - 100 ng/mL  TSH  Result Value Ref Range   TSH 6.02 (H) 0.40 - 4.50 mIU/L  Uric acid  Result Value Ref Range    Uric Acid, Serum 9.2 (H) 4.0 - 8.0 mg/dL  PSA  Result Value Ref Range   PSA 0.64 < OR = 4.00 ng/mL  Hemoglobin A1c  Result Value Ref Range   Hgb A1c MFr Bld 5.7 (H) <5.7 % of total Hgb   Mean Plasma Glucose 117 mg/dL   eAG (mmol/L) 6.5 mmol/L  Lipid panel  Result Value Ref Range   Cholesterol 170 <200 mg/dL   HDL 46 > OR = 40 mg/dL   Triglycerides 086 (H) <150 mg/dL   LDL Cholesterol (Calc) 81 mg/dL (calc)   Total CHOL/HDL Ratio 3.7 <5.0 (calc)   Non-HDL Cholesterol (Calc) 124 <130 mg/dL (calc)  CBC with Differential/Platelet  Result Value Ref Range   WBC 4.5 3.8 - 10.8 Thousand/uL   RBC 3.78 (L) 4.20 - 5.80 Million/uL   Hemoglobin 13.9 13.2 - 17.1 g/dL   HCT 57.8 46.9 - 62.9 %   MCV 105.8 (H) 80.0 - 100.0 fL   MCH 36.8 (H) 27.0 - 33.0 pg   MCHC 34.8 32.0 - 36.0 g/dL   RDW 52.8 41.3 - 24.4 %   Platelets 183 140 - 400 Thousand/uL   MPV 8.8 7.5 - 12.5 fL   Neutro Abs 1,922 1,500 - 7,800 cells/uL   Lymphs Abs 1,895 850 - 3,900 cells/uL   Absolute Monocytes 414 200 - 950 cells/uL   Eosinophils Absolute 252 15 - 500 cells/uL   Basophils Absolute 18 0 - 200 cells/uL   Neutrophils Relative % 42.7 %   Total Lymphocyte 42.1 %   Monocytes Relative 9.2 %   Eosinophils Relative 5.6 %   Basophils Relative 0.4 %  COMPLETE METABOLIC PANEL WITH GFR  Result Value Ref Range   Glucose, Bld 95 65 - 99 mg/dL   BUN 16 7 - 25 mg/dL   Creat 0.10 2.72 - 5.36 mg/dL   eGFR 63 > OR = 60 UY/QIH/4.74Q5   BUN/Creatinine Ratio SEE NOTE: 6 - 22 (calc)   Sodium 139 135 - 146 mmol/L   Potassium 4.2 3.5 - 5.3 mmol/L   Chloride 104 98 - 110 mmol/L   CO2 24 20 - 32 mmol/L   Calcium 8.9 8.6 - 10.3 mg/dL   Total Protein 6.6 6.1 - 8.1 g/dL   Albumin 4.4 3.6 - 5.1 g/dL   Globulin 2.2 1.9 - 3.7 g/dL (calc)   AG Ratio 2.0 1.0 - 2.5 (calc)   Total Bilirubin 0.6 0.2 - 1.2 mg/dL   Alkaline phosphatase (APISO) 47 35 - 144 U/L   AST 31 10 - 35 U/L   ALT 41 9 - 46 U/L      Assessment & Plan:   Problem  List Items Addressed  This Visit     Benign hypertension with CKD (chronic kidney disease) stage III (HCC)    Well-controlled HTN Complication with Atrial Fibrillation   Plan:  1. Continue On Olmesartan 40mg  and Metoprolol XL 50mg  daily 2. Encourage improved lifestyle - low sodium diet, regular exercise 3. Continue monitor BP outside office, bring readings to next visit, if persistently >140/90 or new symptoms notify office sooner       Relevant Medications   metoprolol succinate (TOPROL-XL) 50 MG 24 hr tablet   olmesartan (BENICAR) 40 MG tablet   Chronic atrial fibrillation    Rate controlled Off anticoagulation by patient preference, declines anticoagulation On ASA      Relevant Medications   metoprolol succinate (TOPROL-XL) 50 MG 24 hr tablet   olmesartan (BENICAR) 40 MG tablet   OSA on CPAP    Well controlled, chronic OSA on CPAP - Good adherence to CPAP nightly - Continue current CPAP therapy, patient seems to be benefiting from therapy       Osteoarthritis of both knees   Psoriatic arthritis (HCC)    Followed by Rheumatology      PVD (peripheral vascular disease) (HCC)   Relevant Medications   metoprolol succinate (TOPROL-XL) 50 MG 24 hr tablet   olmesartan (BENICAR) 40 MG tablet   Other Visit Diagnoses     Annual physical exam    -  Primary   Elevated uric acid in blood       Essential hypertension       Relevant Medications   metoprolol succinate (TOPROL-XL) 50 MG 24 hr tablet   olmesartan (BENICAR) 40 MG tablet   Elevated TSH       Hypouricemia       Relevant Orders   T4, free       Updated Health Maintenance information Reviewed recent lab results with patient Encouraged improvement to lifestyle with diet and exercise Goal of weight loss  Elevated A1c 5.7 and elevated Triglyceride Recommend reducing sweet tea and sugar intake to improve these  TSH thyroid signal was abnormal, this suggests a possible LOW Hypothyroidism situation, we will add  an extra lab test and let you know if it confirms the diagnosis we can treat with Levothyroxine. It can help energy overall. It would be a low dose Levothyroxine once daily BEFORE breakfast, empty stomach  Uric Acid 9.2, this shows may be at risk for Gout, but we do not need to treat unless it is a significant concern   Meds ordered this encounter  Medications   metoprolol succinate (TOPROL-XL) 50 MG 24 hr tablet    Sig: Take 1 tablet (50 mg total) by mouth daily. Take with or immediately following a meal.    Dispense:  90 tablet    Refill:  3    Add future refills   olmesartan (BENICAR) 40 MG tablet    Sig: Take 1 tablet (40 mg total) by mouth daily.    Dispense:  90 tablet    Refill:  3    Add future refills     Follow up plan: Return in about 6 months (around 08/23/2023) for 6 month fasting lab then 1 week later Follow up PreDM, Thyroid, Possible Gout.  Repeat labs A1c Uric Acid Lipid TSH + T4 in 6 months  Saralyn Pilar, DO [facility] 02/20/2023, 8:11 AM

## 2023-02-20 NOTE — Assessment & Plan Note (Signed)
Well-controlled HTN Complication with Atrial Fibrillation   Plan:  1. Continue On Olmesartan 40mg  and Metoprolol XL 50mg  daily 2. Encourage improved lifestyle - low sodium diet, regular exercise 3. Continue monitor BP outside office, bring readings to next visit, if persistently >140/90 or new symptoms notify office sooner

## 2023-02-20 NOTE — Assessment & Plan Note (Addendum)
Followed by Rheumatology

## 2023-02-20 NOTE — Assessment & Plan Note (Addendum)
Rate controlled Off anticoagulation by patient preference, declines anticoagulation On ASA

## 2023-02-20 NOTE — Patient Instructions (Addendum)
Thank you for coming to the office today.  Elevated A1c 5.7 and elevated Triglyceride Recommend reducing sweet tea and sugar intake to improve these  TSH thyroid signal was abnormal, this suggests a possible LOW Hypothyroidism situation, we will add an extra lab test and let you know if it confirms the diagnosis we can treat with Levothyroxine. It can help energy overall. It would be a low dose Levothyroxine once daily BEFORE breakfast, empty stomach  Uric Acid 9.2, this shows may be at risk for Gout, but we do not need to treat unless it is a significant concern  DUE for FASTING BLOOD WORK (no food or drink after midnight before the lab appointment, only water or coffee without cream/sugar on the morning of)  SCHEDULE "Lab Only" visit in the morning at the clinic for lab draw in 6 MONTHS   - Make sure Lab Only appointment is at about 1 week before your next appointment, so that results will be available  For Lab Results, once available within 2-3 days of blood draw, you can can log in to MyChart online to view your results and a brief explanation. Also, we can discuss results at next follow-up visit.   Please schedule a Follow-up Appointment to: Return in about 6 months (around 08/23/2023) for 6 month fasting lab then 1 week later Follow up PreDM, Thyroid, Possible Gout.  If you have any other questions or concerns, please feel free to call the office or send a message through MyChart. You may also schedule an earlier appointment if necessary.  Additionally, you may be receiving a survey about your experience at our office within a few days to 1 week by e-mail or mail. We value your feedback.  Saralyn Pilar, DO [facility], [place]

## 2023-02-20 NOTE — Assessment & Plan Note (Signed)
Well controlled, chronic OSA on CPAP - Good adherence to CPAP nightly - Continue current CPAP therapy, patient seems to be benefiting from therapy  

## 2023-02-24 ENCOUNTER — Encounter: Payer: Self-pay | Admitting: Family Medicine

## 2023-02-24 DIAGNOSIS — E039 Hypothyroidism, unspecified: Secondary | ICD-10-CM

## 2023-02-25 MED ORDER — LEVOTHYROXINE SODIUM 25 MCG PO TABS
25.0000 ug | ORAL_TABLET | Freq: Every day | ORAL | 3 refills | Status: DC
Start: 2023-02-25 — End: 2024-03-03

## 2023-03-27 ENCOUNTER — Other Ambulatory Visit: Payer: Self-pay | Admitting: Family Medicine

## 2023-03-27 DIAGNOSIS — I10 Essential (primary) hypertension: Secondary | ICD-10-CM

## 2023-03-28 ENCOUNTER — Ambulatory Visit (INDEPENDENT_AMBULATORY_CARE_PROVIDER_SITE_OTHER): Payer: Medicare Other

## 2023-03-28 VITALS — BP 120/70 | Ht 76.0 in | Wt 259.4 lb

## 2023-03-28 DIAGNOSIS — Z0000 Encounter for general adult medical examination without abnormal findings: Secondary | ICD-10-CM | POA: Diagnosis not present

## 2023-03-28 NOTE — Progress Notes (Signed)
Subjective:   Daniel Brandt is a 89 y.o. male who presents for Medicare Annual/Subsequent preventive examination.  Visit Complete: In person  Cardiac Risk Factors include: advanced age (>81men, >91 women);hypertension;male gender;obesity (BMI >30kg/m2);sedentary lifestyle     Objective:    Today's Vitals   03/28/23 1357 03/28/23 1358  BP: 120/70   Weight: 259 lb 6.4 oz (117.7 kg)   Height: 6\' 4"  (1.93 m)   PainSc:  4    Body mass index is 31.58 kg/m.     03/28/2023    2:10 PM 01/04/2021    8:36 AM 11/29/2020   11:48 AM 10/20/2019   11:22 AM 04/02/2018    2:58 PM 04/01/2017    2:04 PM 10/04/2016    2:27 PM  Advanced Directives  Does Patient Have a Medical Advance Directive? No No Yes Yes Yes Yes Yes  Type of Surveyor, minerals;Living will Living will;Healthcare Power of [employer] Power of [place];Living will Healthcare Power of [place];Living will Healthcare Power of [place];Living will  Copy of Healthcare Power of Attorney in Chart?   No - copy requested No - copy requested No - copy requested No - copy requested   Would patient like information on creating a medical advance directive? No - Patient declined No - Patient declined         Current Medications (verified) Outpatient Encounter Medications as of 03/28/2023  Medication Sig   aspirin 81 MG tablet Take 1 tablet (81 mg total) by mouth daily.   fluticasone (FLONASE) 50 MCG/ACT nasal spray Place 2 sprays into both nostrils daily. Use for 4-6 weeks then stop and use seasonally or as needed.   levothyroxine (SYNTHROID) 25 MCG tablet Take 1 tablet (25 mcg total) by mouth daily before breakfast.   metoprolol succinate (TOPROL-XL) 50 MG 24 hr tablet Take 1 tablet (50 mg total) by mouth daily. Take with or immediately following a meal.   Multiple Vitamins-Minerals (MULTIVITAMIN WITH MINERALS) tablet Take 1 tablet by mouth daily.   NON FORMULARY CPAP nightly   olmesartan (BENICAR) 40  MG tablet Take 1 tablet (40 mg total) by mouth daily.   Omega-3 Fatty Acids (FISH OIL) 1000 MG CAPS Take 3 capsules by mouth 2 (two) times daily.   triamcinolone 0.1%-Cerave equivalent 1:1 cream mixture Apply topically 2 (two) times daily as needed.   vitamin B-12 (CYANOCOBALAMIN) 500 MCG tablet Take 500 mcg by mouth daily.   No facility-administered encounter medications on file as of 03/28/2023.    Allergies (verified) Patient has no known allergies.   History: Past Medical History:  Diagnosis Date   A-fib (HCC)    Arthritis    FH: heart failure 10/04/2016   Former smoker    Heart murmur    Hypertension    Malaria    Stroke [facility])    TB lung, latent    Completed 4 months of LTBI tx with Rifampin ~09/09/2020   Past Surgical History:  Procedure Laterality Date   COLONOSCOPY  06/12/2016   FRACTURE SURGERY     KNEE REPLACE Bilateral 2017   LUMBAR FUSION  1995   L3 L4   Family History  Problem Relation Age of Onset   Heart disease Mother    Heart disease Father    Gout Sister    Heart attack Son    Social History   Socioeconomic History   Marital status: Married    Spouse name: Reade Gratton   Number of children: 6  Years of education: Not on file   Highest education level: Master's degree (e.g., MA, MS, MEng, MEd, MSW, MBA)  Occupational History   Occupation: Retired  Tobacco Use   Smoking status: Former    Current packs/day: 0.00    Average packs/day: 1 pack/day for 0.5 years (0.5 ttl pk-yrs)    Types: Cigarettes    Start date: 04/05/1956    Quit date: 10/04/1956    Years since quitting: 66.5   Smokeless tobacco: Former   Tobacco comments:    smoking cessation materials not required  Vaping Use   Vaping status: Never Used  Substance and Sexual Activity   Alcohol use: Yes    Alcohol/week: 28.0 standard drinks of alcohol    Types: 28 Shots of liquor per week    Comment: Scotch   Drug use: No   Sexual activity: Not Currently  Other Topics Concern   Not on  file  Social History Narrative   Not on file   Social Determinants of Health   Financial Resource Strain: Low Risk  (03/28/2023)   Overall Financial Resource Strain (CARDIA)    Difficulty of Paying Living Expenses: Not hard at all  Food Insecurity: No Food Insecurity (03/28/2023)   Hunger Vital Sign    Worried About Running Out of Food in the Last Year: Never true    Ran Out of Food in the Last Year: Never true  Transportation Needs: No Transportation Needs (03/28/2023)   PRAPARE - Administrator, Civil Service (Medical): No    Lack of Transportation (Non-Medical): No  Physical Activity: Unknown (01/04/2023)   Exercise Vital Sign    Days of Exercise per Week: 0 days    Minutes of Exercise per Session: Not on file  Stress: No Stress Concern Present (03/28/2023)   [employer] of Occupational Health - Occupational Stress Questionnaire    Feeling of Stress : Only a little  Social Connections: Moderately Integrated (03/28/2023)   Social Connection and Isolation Panel [NHANES]    Frequency of Communication with Friends and Family: More than three times a week    Frequency of Social Gatherings with Friends and Family: Twice a week    Attends Religious Services: Never    Database administrator or Organizations: Yes    Attends Engineer, structural: More than 4 times per year    Marital Status: Married    Tobacco Counseling Counseling given: Not Answered Tobacco comments: smoking cessation materials not required   Clinical Intake:  Pre-visit preparation completed: Yes  Pain : 0-10 Pain Score: 4  Pain Type: Chronic pain Pain Location: Wrist Pain Orientation: Right, Left Pain Descriptors / Indicators: Aching, Throbbing Pain Onset: More than a month ago Pain Frequency: Intermittent     Nutritional Status: BMI > 30  Obese Nutritional Risks: None Diabetes: No  How often do you need to have someone help you when you read instructions, pamphlets, or other  written materials from your doctor or pharmacy?: 1 - Never  Interpreter Needed?: No  Information entered by :: Kennedy Bucker, LPN   Activities of Daily Living    03/28/2023    2:16 PM 02/20/2023    7:54 AM  In your present state of health, do you have any difficulty performing the following activities:  Hearing? 1 1  Vision? 0 1  Difficulty concentrating or making decisions? 0 0  Walking or climbing stairs? 1 1  Dressing or bathing? 0 0  Doing errands, shopping? 0 0  Preparing Food and eating ? N   Using the Toilet? N   In the past six months, have you accidently leaked urine? N   Do you have problems with loss of bowel control? N   Managing your Medications? N   Managing your Finances? N   Housekeeping or managing your Housekeeping? N     Patient Care Team: Smitty Cords, DO as PCP - General (Family Medicine) Dewaine Conger, MD as Referring Physician (Gastroenterology) Mena Goes, MD as Referring Physician (Dermatology)  Indicate any recent Medical Services you may have received from other than [facility] providers in the past year (date may be approximate).     Assessment:   This is a routine wellness examination for [place].  Hearing/Vision screen Hearing Screening - Comments:: No aids, hard of hearing  Vision Screening - Comments:: Readers- [facility]   Goals Addressed             This Visit's Progress    DIET - EAT MORE FRUITS AND VEGETABLES         Depression Screen    03/28/2023    2:04 PM 02/20/2023    7:54 AM 01/08/2023   10:41 AM 06/19/2022   12:01 PM 01/23/2022    9:09 AM 01/11/2022    9:47 AM 10/13/2021    8:02 AM  PHQ 2/9 Scores  PHQ - 2 Score 0 0 0 0 1 1 3   PHQ- 9 Score 0 1 2 3 3 4 6     Fall Risk    03/28/2023    2:15 PM 02/20/2023    7:54 AM 01/08/2023   10:42 AM 06/19/2022   12:01 PM 01/23/2022    9:09 AM  Fall Risk   Falls in the past year? 0 0 0 0 0  Number falls in past yr: 0 0 0 0 0  Injury with Fall? 0 0 0 0 0  Risk for fall  due to : No Fall Risks No Fall Risks No Fall Risks No Fall Risks No Fall Risks  Follow up Falls prevention discussed;Falls evaluation completed Falls prevention discussed;Education provided;Falls evaluation completed Falls evaluation completed Falls evaluation completed Falls evaluation completed    MEDICARE RISK AT HOME: Medicare Risk at Home Any stairs in or around the home?: No If so, are there any without handrails?: No Home free of loose throw rugs in walkways, pet beds, electrical cords, etc?: Yes Adequate lighting in your home to reduce risk of falls?: Yes Life alert?: No Use of a cane, walker or w/c?: No Grab bars in the bathroom?: Yes Shower chair or bench in shower?: Yes Elevated toilet seat or a handicapped toilet?: No  TIMED UP AND GO:  Was the test performed?  No    Cognitive Function:        03/28/2023    2:17 PM 01/11/2022    9:52 AM 11/29/2020   11:56 AM 10/20/2019   11:27 AM 04/02/2018    2:59 PM  6CIT Screen  What Year? 0 points 0 points 0 points 0 points 0 points  What month? 0 points 0 points 0 points 0 points 0 points  What time? 0 points 0 points 0 points 0 points 0 points  Count back from 20 0 points 0 points 0 points 0 points 0 points  Months in reverse 0 points 0 points 0 points 0 points 0 points  Repeat phrase 0 points 0 points 0 points 0 points 0 points  Total Score 0 points 0  points 0 points 0 points 0 points    Immunizations Immunization History  Administered Date(s) Administered   Influenza, High Dose Seasonal PF 04/01/2017, 04/02/2018   Influenza,inj,Quad PF,6+ Mos 03/09/2016   PFIZER Comirnaty(Gray Top)Covid-19 Tri-Sucrose Vaccine 09/06/2019   PFIZER(Purple Top)SARS-COV-2 Vaccination 09/06/2019   Pneumococcal Conjugate-13 03/09/2016   Pneumococcal Polysaccharide-23 10/09/2017   Tdap 10/04/2016   Zoster Recombinant(Shingrix) 08/20/2016, 12/21/2016    TDAP status: Up to date  Flu Vaccine status: Declined, Education has been provided  regarding the importance of this vaccine but patient still declined. Advised may receive this vaccine at local pharmacy or Health Dept. Aware to provide a copy of the vaccination record if obtained from local pharmacy or Health Dept. Verbalized acceptance and understanding.  Pneumococcal vaccine status: Up to date  Covid-19 vaccine status: Completed vaccines  Qualifies for Shingles Vaccine? Yes   Zostavax completed No   Shingrix Completed?: Yes  Screening Tests Health Maintenance  Topic Date Due   COVID-19 Vaccine (3 - Pfizer risk series) 10/04/2019   INFLUENZA VACCINE  02/07/2023   Medicare Annual Wellness (AWV)  03/27/2024   DTaP/Tdap/Td (2 - Td or Tdap) 10/05/2026   Pneumonia Vaccine 26+ Years old  Completed   Zoster Vaccines- Shingrix  Completed   HPV VACCINES  Aged Out    Health Maintenance  Health Maintenance Due  Topic Date Due   COVID-19 Vaccine (3 - Pfizer risk series) 10/04/2019   INFLUENZA VACCINE  02/07/2023    Colorectal cancer screening: No longer required.   Lung Cancer Screening: (Low Dose CT Chest recommended if Age 77-80 years, 20 pack-year currently smoking OR have quit w/in 15years.) does not qualify.    Additional Screening:  Hepatitis C Screening: does not qualify; Completed no  Vision Screening: Recommended annual ophthalmology exams for early detection of glaucoma and other disorders of the eye. Is the patient up to date with their annual eye exam?  Yes  Who is the provider or what is the name of the office in which the patient attends annual eye exams? [facility] If pt is not established with a provider, would they like to be referred to a provider to establish care? No .   Dental Screening: Recommended annual dental exams for proper oral hygiene   Community Resource Referral / Chronic Care Management: CRR required this visit?  No   CCM required this visit?  No     Plan:     I have personally reviewed and noted the following in the  patient's chart:   Medical and social history Use of alcohol, tobacco or illicit drugs  Current medications and supplements including opioid prescriptions. Patient is not currently taking opioid prescriptions. Functional ability and status Nutritional status Physical activity Advanced directives List of other physicians Hospitalizations, surgeries, and ER visits in previous 12 months Vitals Screenings to include cognitive, depression, and falls Referrals and appointments  In addition, I have reviewed and discussed with patient certain preventive protocols, quality metrics, and best practice recommendations. A written personalized care plan for preventive services as well as general preventive health recommendations were provided to patient.     Hal Hope, LPN   1/61/0960   After Visit Summary: my chart  Nurse Notes: none

## 2023-03-28 NOTE — Telephone Encounter (Signed)
Request is too soon, last refill 02/20/23 for 90 and 1 refill.  Requested Prescriptions  Pending Prescriptions Disp Refills   olmesartan (BENICAR) 40 MG tablet [Pharmacy Med Name: OLMESARTAN MEDOXOMIL 40MG  TABLETS] 90 tablet 3    Sig: TAKE 1 TABLET(40 MG) BY MOUTH DAILY     Cardiovascular:  Angiotensin Receptor Blockers Passed - 03/27/2023  8:27 AM      Passed - Cr in normal range and within 180 days    Creat  Date Value Ref Range Status  02/15/2023 1.13 0.70 - 1.22 mg/dL Final         Passed - K in normal range and within 180 days    Potassium  Date Value Ref Range Status  02/15/2023 4.2 3.5 - 5.3 mmol/L Final         Passed - Patient is not pregnant      Passed - Last BP in normal range    BP Readings from Last 1 Encounters:  02/20/23 (!) 117/58         Passed - Valid encounter within last 6 months    Recent Outpatient Visits           1 month ago Annual physical exam   [facility] [place], Netta Neat, DO   2 months ago Toe deformity, right   [facility] Smitty Cords, DO   9 months ago Acute non-recurrent frontal sinusitis   [facility] Smitty Cords, DO   1 year ago Annual physical exam   [facility] Smitty Cords, DO   1 year ago Psoriatic arthritis [facility])   [facility] Althea Charon, Netta Neat, DO       Future Appointments             In 5 months Althea Charon, Netta Neat, DO [facility], [facility]

## 2023-03-28 NOTE — Patient Instructions (Addendum)
Daniel Brandt , Thank you for taking time to come for your Medicare Wellness Visit. I appreciate your ongoing commitment to your health goals. Please review the following plan we discussed and let me know if I can assist you in the future.   Referrals/Orders/Follow-Ups/Clinician Recommendations: none  This is a list of the screening recommended for you and due dates:  Health Maintenance  Topic Date Due   COVID-19 Vaccine (3 - Pfizer risk series) 10/04/2019   Flu Shot  02/07/2023   Medicare Annual Wellness Visit  03/27/2024   DTaP/Tdap/Td vaccine (2 - Td or Tdap) 10/05/2026   Pneumonia Vaccine  Completed   Zoster (Shingles) Vaccine  Completed   HPV Vaccine  Aged Out    Advanced directives: (ACP Link)Information on Advanced Care Planning can be found at [facility] [place] Advance Health Care Directives Advance Health Care Directives ([email or web address]   Next Medicare Annual Wellness Visit scheduled for next year: Yes    04/02/24 @ 10:15 am in person

## 2023-06-30 ENCOUNTER — Encounter: Payer: Self-pay | Admitting: Family Medicine

## 2023-07-01 DIAGNOSIS — Z0279 Encounter for issue of other medical certificate: Secondary | ICD-10-CM

## 2023-07-12 ENCOUNTER — Other Ambulatory Visit: Payer: Self-pay | Admitting: Family Medicine

## 2023-07-12 DIAGNOSIS — M2061 Acquired deformities of toe(s), unspecified, right foot: Secondary | ICD-10-CM

## 2023-07-12 DIAGNOSIS — M2062 Acquired deformities of toe(s), unspecified, left foot: Secondary | ICD-10-CM

## 2023-08-02 ENCOUNTER — Ambulatory Visit (INDEPENDENT_AMBULATORY_CARE_PROVIDER_SITE_OTHER): Payer: Medicare Other

## 2023-08-02 ENCOUNTER — Ambulatory Visit: Payer: Medicare Other | Admitting: Podiatry

## 2023-08-02 ENCOUNTER — Encounter: Payer: Self-pay | Admitting: Podiatry

## 2023-08-02 DIAGNOSIS — M2041 Other hammer toe(s) (acquired), right foot: Secondary | ICD-10-CM

## 2023-08-02 DIAGNOSIS — M1A9XX1 Chronic gout, unspecified, with tophus (tophi): Secondary | ICD-10-CM

## 2023-08-02 NOTE — Progress Notes (Signed)
   Chief Complaint  Patient presents with   Toe Pain    "I'm here about my toe on my right foot." N - toe  L - 2nd digit right D - 5 years O - suddenly, gotten worse C - swollen, A - tight shoe T - wear loose fitting shoes    HPI: 89 y.o. male presenting today as a new patient for evaluation of a lesion that developed to the second digit of the right foot.  Gradual onset over 5 years.  History of arthritis to the feet and family history of gout.  Patient states that he has no pain or tenderness associated to the toe.  Past Medical History:  Diagnosis Date   A-fib [facility])    Arthritis    FH: heart failure 10/04/2016   Former smoker    Heart murmur    Hypertension    Malaria    Stroke [facility])    TB lung, latent    Completed 4 months of LTBI tx with Rifampin ~09/09/2020    Past Surgical History:  Procedure Laterality Date   COLONOSCOPY  06/12/2016   FRACTURE SURGERY     KNEE REPLACE Bilateral 2017   LUMBAR FUSION  1995   L3 L4    No Known Allergies   LT foot 08/02/2023   Physical Exam: General: The patient is alert and oriented x3 in no acute distress.  Dermatology: Skin is warm, dry and supple bilateral lower extremities.  No open wounds noted  Vascular: Palpable pedal pulses bilaterally. Capillary refill within normal limits.  No appreciable edema.  No erythema.  Neurological: Grossly intact via light touch  Musculoskeletal Exam: Chronic severe arthritic changes noted to the feet bilateral.  Large, firm, mass noted around the PIPJ of the second digit of the right foot clinically resembles tophaceous gout  Radiographic Exam RT foot 08/02/2023:  Advanced severe degenerative changes noted throughout the pedal joints of the feet.  No acute fractures identified.  Assessment/Plan of Care: 1.  Tophaceous gout second digit right foot  -Patient evaluated.  X-rays reviewed -For now the lesion is completely asymptomatic and he has no pain or tenderness associated.  Simply  observe for now -We did discuss the possibility of surgical excision however at this time we will hold off and observe since it is asymptomatic. -If the lesion gets larger in size or if it becomes symptomatic recommend that he comes in for reevaluation.       Felecia Shelling, DPM [facility]  Dr. Felecia Shelling, DPM    [address] [place], [place] [address]                Office [phone]  Fax [phone]

## 2023-08-13 ENCOUNTER — Ambulatory Visit: Payer: Medicare Other | Admitting: Family Medicine

## 2023-08-13 VITALS — BP 138/80 | HR 107 | Ht 76.0 in | Wt 261.0 lb

## 2023-08-13 DIAGNOSIS — E039 Hypothyroidism, unspecified: Secondary | ICD-10-CM | POA: Diagnosis not present

## 2023-08-13 DIAGNOSIS — I10 Essential (primary) hypertension: Secondary | ICD-10-CM

## 2023-08-13 NOTE — Patient Instructions (Addendum)
 Thank you for coming to the office today.  Keep monitoring BP at home I am not 100% sure the high reading is accurate We will check labs in 1 week, for thyroid  sugar etc. And we can adjust treatment accordingly, visit is on 08/28/23 - bring your BP cuff!  Please schedule a Follow-up Appointment to: Return if symptoms worsen or fail to improve.  If you have any other questions or concerns, please feel free to call the office or send a message through MyChart. You may also schedule an earlier appointment if necessary.  Additionally, you may be receiving a survey about your experience at our office within a few days to 1 week by e-mail or mail. We value your feedback.  Marsa Officer, DO [facility], [place]

## 2023-08-13 NOTE — Progress Notes (Signed)
 Subjective:    Patient ID: Daniel Brandt, male    DOB: Feb 20, 1934, 89 y.o.   MRN: [id number]  Daniel Brandt is a 89 y.o. male presenting on 08/13/2023 for Hypertension   HPI  Discussed the use of AI scribe software for clinical note transcription with the patient, who gave verbal consent to proceed.  History of Present Illness    Daniel Brandt is an 89 year old male with hypertension who presents with concerns about blood pressure readings.   Daniel Brandt is concerned about his blood pressure readings after using a home monitor for the first time, which showed a reading of 168/95 mmHg yesterday. His wife, who regularly uses the monitor, has consistently low readings. Today, his blood pressure was 138/80 mmHg. Daniel Brandt did not bring the monitor to the appointment for comparison.  Daniel Brandt is currently taking metoprolol  XL 50 mg daily for hypertension. Daniel Brandt has recently started a new thyroid  medication at a low dose since August but has not noticed any changes in his energy levels or other symptoms. Daniel Brandt has also reduced his sugar intake.          03/28/2023    2:04 PM 02/20/2023    7:54 AM 01/08/2023   10:41 AM  Depression screen PHQ 2/9  Decreased Interest 0 0 0  Down, Depressed, Hopeless 0 0 0  PHQ - 2 Score 0 0 0  Altered sleeping 0 0 0  Tired, decreased energy 0 0 1  Change in appetite 0 0 1  Feeling bad or failure about yourself  0 1 0  Trouble concentrating 0 0 0  Moving slowly or fidgety/restless 0 0 0  Suicidal thoughts 0 0 0  PHQ-9 Score 0 1 2  Difficult doing work/chores Not difficult at all Not difficult at all Not difficult at all       02/20/2023    8:00 AM 01/08/2023   10:42 AM 06/19/2022   12:01 PM 01/23/2022    9:09 AM  GAD 7 : Generalized Anxiety Score  Nervous, Anxious, on Edge 0 0 0 0  Control/stop worrying 0 0 0 0  Worry too much - different things 0 0 0 1  Trouble relaxing 0 0 0 0  Restless 0 0 0 0  Easily annoyed or irritable 0 0 0 1  Afraid - awful might happen 0 0 3 1   Total GAD 7 Score 0 0 3 3  Anxiety Difficulty Not difficult at all Not difficult at all Somewhat difficult Somewhat difficult    Social History   Tobacco Use   Smoking status: Some Days    Current packs/day: 0.00    Average packs/day: 1 pack/day for 0.5 years (0.5 ttl pk-yrs)    Types: Cigarettes, Cigars    Start date: 04/05/1956    Last attempt to quit: 10/04/1956    Years since quitting: 66.9   Smokeless tobacco: Former   Tobacco comments:    smoking cessation materials not required        I haven't smoked cigarettes since I was 21.  I will smoke a cigar, every now and then. [facility] 08/02/2023  Vaping Use   Vaping status: Never Used  Substance Use Topics   Alcohol use: Yes    Alcohol/week: 28.0 standard drinks of alcohol    Types: 28 Shots of liquor per week    Comment: Scotch   Drug use: No    Review of Systems Per HPI unless specifically indicated above     Objective:  BP 138/80   Pulse (!) 107   Ht 6' 4 (1.93 m)   Wt 261 lb (118.4 kg)   SpO2 98%   BMI 31.77 kg/m   Wt Readings from Last 3 Encounters:  08/13/23 261 lb (118.4 kg)  03/28/23 259 lb 6.4 oz (117.7 kg)  02/20/23 257 lb 12.8 oz (116.9 kg)    Physical Exam Vitals and nursing note reviewed.  Constitutional:      General: Daniel Brandt is not in acute distress.    Appearance: Normal appearance. Daniel Brandt is well-developed. Daniel Brandt is not diaphoretic.     Comments: Well-appearing, comfortable, cooperative  HENT:     Head: Normocephalic and atraumatic.  Eyes:     General:        Right eye: No discharge.        Left eye: No discharge.     Conjunctiva/sclera: Conjunctivae normal.  Cardiovascular:     Rate and Rhythm: Normal rate.  Pulmonary:     Effort: Pulmonary effort is normal.  Skin:    General: Skin is warm and dry.     Findings: No erythema or rash.  Neurological:     Mental Status: Daniel Brandt is alert and oriented to person, place, and time.  Psychiatric:        Mood and Affect: Mood normal.        Behavior:  Behavior normal.        Thought Content: Thought content normal.     Comments: Well groomed, good eye contact, normal speech and thoughts     Results for orders placed or performed in visit on 02/15/23  Vitamin B12   Collection Time: 02/15/23  8:18 AM  Result Value Ref Range   Vitamin B-12 459 200 - 1,100 pg/mL  VITAMIN D  25 Hydroxy (Vit-D Deficiency, Fractures)   Collection Time: 02/15/23  8:18 AM  Result Value Ref Range   Vit D, 25-Hydroxy 57 30 - 100 ng/mL  TSH   Collection Time: 02/15/23  8:18 AM  Result Value Ref Range   TSH 6.02 (H) 0.40 - 4.50 mIU/L  Uric acid   Collection Time: 02/15/23  8:18 AM  Result Value Ref Range   Uric Acid, Serum 9.2 (H) 4.0 - 8.0 mg/dL  PSA   Collection Time: 02/15/23  8:18 AM  Result Value Ref Range   PSA 0.64 < OR = 4.00 ng/mL  Hemoglobin A1c   Collection Time: 02/15/23  8:18 AM  Result Value Ref Range   Hgb A1c MFr Bld 5.7 (H) <5.7 % of total Hgb   Mean Plasma Glucose 117 mg/dL   eAG (mmol/L) 6.5 mmol/L  Lipid panel   Collection Time: 02/15/23  8:18 AM  Result Value Ref Range   Cholesterol 170 <200 mg/dL   HDL 46 > OR = 40 mg/dL   Triglycerides 643 (H) <150 mg/dL   LDL Cholesterol (Calc) 81 mg/dL (calc)   Total CHOL/HDL Ratio 3.7 <5.0 (calc)   Non-HDL Cholesterol (Calc) 124 <130 mg/dL (calc)  CBC with Differential/Platelet   Collection Time: 02/15/23  8:18 AM  Result Value Ref Range   WBC 4.5 3.8 - 10.8 Thousand/uL   RBC 3.78 (L) 4.20 - 5.80 Million/uL   Hemoglobin 13.9 13.2 - 17.1 g/dL   HCT 59.9 61.4 - 49.9 %   MCV 105.8 (H) 80.0 - 100.0 fL   MCH 36.8 (H) 27.0 - 33.0 pg   MCHC 34.8 32.0 - 36.0 g/dL   RDW 86.9 88.9 - 84.9 %   Platelets 183  140 - 400 Thousand/uL   MPV 8.8 7.5 - 12.5 fL   Neutro Abs 1,922 1,500 - 7,800 cells/uL   Lymphs Abs 1,895 850 - 3,900 cells/uL   Absolute Monocytes 414 200 - 950 cells/uL   Eosinophils Absolute 252 15 - 500 cells/uL   Basophils Absolute 18 0 - 200 cells/uL   Neutrophils Relative %  42.7 %   Total Lymphocyte 42.1 %   Monocytes Relative 9.2 %   Eosinophils Relative 5.6 %   Basophils Relative 0.4 %  COMPLETE METABOLIC PANEL WITH GFR   Collection Time: 02/15/23  8:18 AM  Result Value Ref Range   Glucose, Bld 95 65 - 99 mg/dL   BUN 16 7 - 25 mg/dL   Creat 8.86 9.29 - 8.77 mg/dL   eGFR 63 > OR = 60 fO/fpw/8.26f7   BUN/Creatinine Ratio SEE NOTE: 6 - 22 (calc)   Sodium 139 135 - 146 mmol/L   Potassium 4.2 3.5 - 5.3 mmol/L   Chloride 104 98 - 110 mmol/L   CO2 24 20 - 32 mmol/L   Calcium 8.9 8.6 - 10.3 mg/dL   Total Protein 6.6 6.1 - 8.1 g/dL   Albumin 4.4 3.6 - 5.1 g/dL   Globulin 2.2 1.9 - 3.7 g/dL (calc)   AG Ratio 2.0 1.0 - 2.5 (calc)   Total Bilirubin 0.6 0.2 - 1.2 mg/dL   Alkaline phosphatase (APISO) 47 35 - 144 U/L   AST 31 10 - 35 U/L   ALT 41 9 - 46 U/L  T4, free   Collection Time: 02/15/23  8:18 AM  Result Value Ref Range   Free T4 1.1 0.8 - 1.8 ng/dL  TEST AUTHORIZATION   Collection Time: 02/15/23  8:18 AM  Result Value Ref Range   TEST NAME: T4, FREE    TEST CODE: [id number]    CLIENT CONTACT: TAMMY I WNGLN    REPORT ALWAYS MESSAGE SIGNATURE        Assessment & Plan:   Problem List Items Addressed This Visit   None Visit Diagnoses       Essential hypertension    -  Primary     Acquired hypothyroidism             Hypertension Recent home blood pressure reading of 168/95, which is significantly elevated. However, the accuracy of the home monitor is uncertain. Today's office reading was 138/80, which is within acceptable range for his age. -Continue Metoprolol  XL 50mg  daily. -Bring home blood pressure monitor to next appointment for calibration check. -Continue to monitor blood pressure at home and record readings.  Hypothyroidism On low dose thyroid  replacement therapy levothyroxine  25mcg recent trial. The patient reports no noticeable changes in energy level or metabolism since starting the medication. -Check thyroid  function tests on  08/21/2023. -Continue current thyroid  replacement therapy until lab results are reviewed.  Follow-up The patient has a follow-up appointment scheduled for 08/28/2023. -Bring home blood pressure monitor to appointment for calibration check. -Review thyroid  function test results and adjust treatment plan as necessary.         No orders of the defined types were placed in this encounter.   No orders of the defined types were placed in this encounter.   Follow up plan: Return if symptoms worsen or fail to improve.     Marsa Officer, DO [facility] 08/13/2023, 11:20 AM

## 2023-08-21 ENCOUNTER — Other Ambulatory Visit: Payer: Medicare Other

## 2023-08-21 DIAGNOSIS — E781 Pure hyperglyceridemia: Secondary | ICD-10-CM

## 2023-08-21 DIAGNOSIS — E790 Hyperuricemia without signs of inflammatory arthritis and tophaceous disease: Secondary | ICD-10-CM | POA: Diagnosis not present

## 2023-08-21 DIAGNOSIS — R7989 Other specified abnormal findings of blood chemistry: Secondary | ICD-10-CM

## 2023-08-21 DIAGNOSIS — R7309 Other abnormal glucose: Secondary | ICD-10-CM

## 2023-08-21 DIAGNOSIS — I129 Hypertensive chronic kidney disease with stage 1 through stage 4 chronic kidney disease, or unspecified chronic kidney disease: Secondary | ICD-10-CM | POA: Diagnosis not present

## 2023-08-22 ENCOUNTER — Encounter: Payer: Self-pay | Admitting: Family Medicine

## 2023-08-22 LAB — HEMOGLOBIN A1C
Hgb A1c MFr Bld: 5.5 %{Hb} (ref <5.7)
Mean Plasma Glucose: 111 mg/dL
eAG (mmol/L): 6.2 mmol/L

## 2023-08-22 LAB — LIPID PANEL
Cholesterol: 161 mg/dL (ref <200)
HDL: 60 mg/dL (ref >=40)
LDL Cholesterol (Calc): 79 mg/dL
Non-HDL Cholesterol (Calc): 101 mg/dL (ref <130)
Total CHOL/HDL Ratio: 2.7 (calc) (ref <5.0)
Triglycerides: 127 mg/dL (ref <150)

## 2023-08-22 LAB — URIC ACID: Uric Acid, Serum: 10 mg/dL — ABNORMAL HIGH (ref 4.0–8.0)

## 2023-08-22 LAB — T4, FREE: Free T4: 1.1 ng/dL (ref 0.8–1.8)

## 2023-08-22 LAB — TSH: TSH: 4.12 m[IU]/L (ref 0.40–4.50)

## 2023-08-27 ENCOUNTER — Ambulatory Visit: Payer: Medicare Other | Admitting: Family Medicine

## 2023-08-27 ENCOUNTER — Other Ambulatory Visit: Payer: Self-pay | Admitting: Family Medicine

## 2023-08-27 ENCOUNTER — Encounter: Payer: Self-pay | Admitting: Family Medicine

## 2023-08-27 VITALS — BP 134/68 | HR 77 | Ht 76.0 in | Wt 261.0 lb

## 2023-08-27 DIAGNOSIS — I129 Hypertensive chronic kidney disease with stage 1 through stage 4 chronic kidney disease, or unspecified chronic kidney disease: Secondary | ICD-10-CM | POA: Diagnosis not present

## 2023-08-27 DIAGNOSIS — E790 Hyperuricemia without signs of inflammatory arthritis and tophaceous disease: Secondary | ICD-10-CM

## 2023-08-27 DIAGNOSIS — N183 Chronic kidney disease, stage 3 unspecified: Secondary | ICD-10-CM

## 2023-08-27 DIAGNOSIS — E039 Hypothyroidism, unspecified: Secondary | ICD-10-CM

## 2023-08-27 DIAGNOSIS — R351 Nocturia: Secondary | ICD-10-CM

## 2023-08-27 DIAGNOSIS — I10 Essential (primary) hypertension: Secondary | ICD-10-CM | POA: Diagnosis not present

## 2023-08-27 DIAGNOSIS — R7989 Other specified abnormal findings of blood chemistry: Secondary | ICD-10-CM

## 2023-08-27 DIAGNOSIS — M2062 Acquired deformities of toe(s), unspecified, left foot: Secondary | ICD-10-CM

## 2023-08-27 DIAGNOSIS — E781 Pure hyperglyceridemia: Secondary | ICD-10-CM

## 2023-08-27 DIAGNOSIS — R7309 Other abnormal glucose: Secondary | ICD-10-CM

## 2023-08-27 DIAGNOSIS — Z0000 Encounter for general adult medical examination without abnormal findings: Secondary | ICD-10-CM

## 2023-08-27 NOTE — Patient Instructions (Addendum)
Thank you for coming to the office today.  Thyroid labs controlled on current dose of medication. Keep on Levothyroxine   Cholesterol results are also well controlled.   Uric Acid is 10.0, still elevated. This does not guarantee gout flare will occur, but it could. If you do get a flare up please seek care promptly. We can consider Allopurinol in future to reduce Uric Acid.   A1c is 5.5, improved now below pre diabetes range  Monitor BP, today is improved.  DUE for FASTING BLOOD WORK (no food or drink after midnight before the lab appointment, only water or coffee without cream/sugar on the morning of)  SCHEDULE "Lab Only" visit in the morning at the clinic for lab draw in 6 MONTHS   - Make sure Lab Only appointment is at about 1 week before your next appointment, so that results will be available  For Lab Results, once available within 2-3 days of blood draw, you can can log in to MyChart online to view your results and a brief explanation. Also, we can discuss results at next follow-up visit.   Please schedule a Follow-up Appointment to: Return for 6 month fasting lab > 1 week later Annual Physical.  If you have any other questions or concerns, please feel free to call the office or send a message through MyChart. You may also schedule an earlier appointment if necessary.  Additionally, you may be receiving a survey about your experience at our office within a few days to 1 week by e-mail or mail. We value your feedback.  Saralyn Pilar, DO [facility], [place]

## 2023-08-27 NOTE — Progress Notes (Signed)
Subjective:    Patient ID: Daniel Brandt, male    DOB: 1934/02/05, 89 y.o.   MRN: [id number]  Daniel Brandt is a 89 y.o. male presenting on 08/27/2023 for Hyperlipidemia (Lab results)   HPI  Discussed the use of AI scribe software for clinical note transcription with the patient, who gave verbal consent to proceed.  History of Present Illness   Daniel Brandt is an 89 year old male who presents for a follow-up visit to review recent blood work results.  He is particularly interested in understanding the outcomes concerning his thyroid, cholesterol, blood sugar, and uric acid levels.  Hypothyroidism, new diagnosis He has been on a low dose of thyroid medication, 25 mg, which has successfully brought his thyroid levels back into range. No significant changes in energy or metabolism have been noticed since starting the medication, but his thyroid issue was not severe initially.  HyperTriglyceridemia His cholesterol levels have shown notable improvement, with triglycerides decreasing from 300 to 127 and LDL cholesterol from 81 to 79. He is currently taking omega-3 fish oil and not on any cholesterol medication. The fish oil was initially for arthritis, which remains painful, but he is pleased with the cholesterol numbers. Not on statin  Elevated A1c His A1c level has decreased to 5.5, indicating a good three-month average blood sugar level, and he is far from the diabetic range. He feels relieved as he has seen the severe consequences of diabetes in others.  Elevated Uric Acid His uric acid level has increased slightly, which is a concern for gout. He reports recent visit to Podiatry specialist 07/2023 visit where the specialist was not overly concerned. He has a family history of gout, as his sister also suffers from it. He has not taken colchicine. His gout symptoms are more cosmetic than painful at this time.  - he has foot / toe deformity but they did not recommend surgical  intervention.           08/27/2023    8:05 AM 03/28/2023    2:04 PM 02/20/2023    7:54 AM  Depression screen PHQ 2/9  Decreased Interest 0 0 0  Down, Depressed, Hopeless 0 0 0  PHQ - 2 Score 0 0 0  Altered sleeping 0 0 0  Tired, decreased energy 1 0 0  Change in appetite 1 0 0  Feeling bad or failure about yourself  1 0 1  Trouble concentrating 0 0 0  Moving slowly or fidgety/restless 0 0 0  Suicidal thoughts 0 0 0  PHQ-9 Score 3 0 1  Difficult doing work/chores  Not difficult at all Not difficult at all       08/27/2023    8:05 AM 02/20/2023    8:00 AM 01/08/2023   10:42 AM 06/19/2022   12:01 PM  GAD 7 : Generalized Anxiety Score  Nervous, Anxious, on Edge 0 0 0 0  Control/stop worrying 0 0 0 0  Worry too much - different things 0 0 0 0  Trouble relaxing 0 0 0 0  Restless 0 0 0 0  Easily annoyed or irritable 0 0 0 0  Afraid - awful might happen 0 0 0 3  Total GAD 7 Score 0 0 0 3  Anxiety Difficulty  Not difficult at all Not difficult at all Somewhat difficult    Social History   Tobacco Use   Smoking status: Some Days    Current packs/day: 0.00    Average packs/day: 1 pack/day for  0.5 years (0.5 ttl pk-yrs)    Types: Cigarettes, Cigars    Start date: 04/05/1956    Last attempt to quit: 10/04/1956    Years since quitting: 66.9   Smokeless tobacco: Former   Tobacco comments:    smoking cessation materials not required        "I haven't smoked cigarettes since I was 21.  I will smoke a cigar, every now and then." [facility] 08/02/2023  Vaping Use   Vaping status: Never Used  Substance Use Topics   Alcohol use: Yes    Alcohol/week: 28.0 standard drinks of alcohol    Types: 28 Shots of liquor per week    Comment: Scotch   Drug use: No    Review of Systems Per HPI unless specifically indicated above     Objective:    BP 134/68   Pulse 77   Ht 6\' 4"  (1.93 m)   Wt 261 lb (118.4 kg)   SpO2 99%   BMI 31.77 kg/m   Wt Readings from Last 3 Encounters:  08/27/23  261 lb (118.4 kg)  08/13/23 261 lb (118.4 kg)  03/28/23 259 lb 6.4 oz (117.7 kg)    Physical Exam Vitals and nursing note reviewed.  Constitutional:      General: He is not in acute distress.    Appearance: Normal appearance. He is well-developed. He is not diaphoretic.     Comments: Well-appearing, comfortable, cooperative  HENT:     Head: Normocephalic and atraumatic.  Eyes:     General:        Right eye: No discharge.        Left eye: No discharge.     Conjunctiva/sclera: Conjunctivae normal.  Cardiovascular:     Rate and Rhythm: Normal rate.  Pulmonary:     Effort: Pulmonary effort is normal.  Skin:    General: Skin is warm and dry.     Findings: No erythema or rash.  Neurological:     Mental Status: He is alert and oriented to person, place, and time.  Psychiatric:        Mood and Affect: Mood normal.        Behavior: Behavior normal.        Thought Content: Thought content normal.     Comments: Well groomed, good eye contact, normal speech and thoughts     Results for orders placed or performed in visit on 08/21/23  Uric acid   Collection Time: 08/21/23  8:45 AM  Result Value Ref Range   Uric Acid, Serum 10.0 (H) 4.0 - 8.0 mg/dL  T4, free   Collection Time: 08/21/23  8:45 AM  Result Value Ref Range   Free T4 1.1 0.8 - 1.8 ng/dL  TSH   Collection Time: 08/21/23  8:45 AM  Result Value Ref Range   TSH 4.12 0.40 - 4.50 mIU/L  Lipid panel   Collection Time: 08/21/23  8:45 AM  Result Value Ref Range   Cholesterol 161 <200 mg/dL   HDL 60 > OR = 40 mg/dL   Triglycerides 284 <132 mg/dL   LDL Cholesterol (Calc) 79 mg/dL (calc)   Total CHOL/HDL Ratio 2.7 <5.0 (calc)   Non-HDL Cholesterol (Calc) 101 <130 mg/dL (calc)  Hemoglobin G4W   Collection Time: 08/21/23  8:45 AM  Result Value Ref Range   Hgb A1c MFr Bld 5.5 <5.7 % of total Hgb   Mean Plasma Glucose 111 mg/dL   eAG (mmol/L) 6.2 mmol/L      Assessment &  Plan:   Problem List Items Addressed This Visit      Benign hypertension with CKD (chronic kidney disease) stage III (HCC)   Other Visit Diagnoses       Essential hypertension    -  Primary     Acquired hypothyroidism         Deformity of toe of left foot         Elevated uric acid in blood         Hypertriglyceridemia            Hypothyroidism TSH and thyroid hormone levels within normal range on current low dose of Levothyroxine 25mg  daily. No noticeable changes in energy or metabolism reported. -Continue Levothyroxine 25mg  daily.  Hyperlipidemia Significant improvement in triglycerides and LDL cholesterol levels. Currently managed with Omega-3 fish oil. Not on statin therapy -Continue Omega-3 fish oil.  Hyperuricemia Elevated uric acid levels noted, but no recent gout flares reported. Discussed potential for allopurinol therapy, but patient prefers to monitor for symptoms. -Monitor for gout flares and consider allopurinol therapy if symptoms worsen. -if acute flare, return for acute visit, consider prednisone vs colchicine -He has seen Podiatry for foot/toe deformity, thought to be chronic gout but no active flare symptoms, defer surgical intervention at this time.  General Health Maintenance -Repeat blood work in 6 months (August 2025). -Schedule follow-up appointment in 6 months (August 2025).         No orders of the defined types were placed in this encounter.   No orders of the defined types were placed in this encounter.   Follow up plan: Return for 6 month fasting lab > 1 week later Annual Physical.  Future labs ordered for 02/25/24   Saralyn Pilar, DO [facility] 08/27/2023, 8:17 AM

## 2023-08-28 ENCOUNTER — Ambulatory Visit: Payer: Self-pay | Admitting: Family Medicine

## 2023-09-16 ENCOUNTER — Encounter: Payer: Self-pay | Admitting: Family Medicine

## 2023-09-16 ENCOUNTER — Ambulatory Visit (INDEPENDENT_AMBULATORY_CARE_PROVIDER_SITE_OTHER): Admitting: Family Medicine

## 2023-09-16 VITALS — BP 138/70 | HR 79 | Ht 76.0 in | Wt 258.0 lb

## 2023-09-16 DIAGNOSIS — J0110 Acute frontal sinusitis, unspecified: Secondary | ICD-10-CM

## 2023-09-16 DIAGNOSIS — L4050 Arthropathic psoriasis, unspecified: Secondary | ICD-10-CM

## 2023-09-16 MED ORDER — FLUTICASONE PROPIONATE 50 MCG/ACT NA SUSP: 2.0000 | Freq: Every day | NASAL | 3 refills | Status: DC

## 2023-09-16 MED ORDER — CELECOXIB 100 MG PO CAPS: 100.0000 mg | ORAL_CAPSULE | Freq: Two times a day (BID) | ORAL | 3 refills | Status: AC | PRN

## 2023-09-16 NOTE — Patient Instructions (Addendum)
 Thank you for coming to the office today.  Likely sinus congestion leading into chest congestion No sign of bacterial infection today  Recommend the following  Start nasal steroid Flonase 2 sprays in each nostril daily for 4-6 weeks, may repeat course seasonally or as needed  Start OTC Mucinex to clear the chest congestion  Contact back by end of week if not improving or if worsening we can call in an Antiobitic  No sign of infection however today. I do think the congestion settles overnight and have to cough it up.  Also ordered the celebrex 100mg  up to twice a day for joint pain inflammation if needed.  Please schedule a Follow-up Appointment to: Return if symptoms worsen or fail to improve.  If you have any other questions or concerns, please feel free to call the office or send a message through MyChart. You may also schedule an earlier appointment if necessary.  Additionally, you may be receiving a survey about your experience at our office within a few days to 1 week by e-mail or mail. We value your feedback.  Saralyn Pilar, DO [facility], [place]

## 2023-09-16 NOTE — Progress Notes (Signed)
 Subjective:    Patient ID: Daniel Brandt, male    DOB: 04/28/1934, 89 y.o.   MRN: [id number]  Daniel Brandt is a 89 y.o. male presenting on 09/16/2023 for Sinusitis and Arthritis   HPI  Discussed the use of AI scribe software for clinical note transcription with the patient, who gave verbal consent to proceed.  History of Present Illness   He presents with joint pain and respiratory symptoms and arthritis.  He experiences significant joint pain, previously managed with  NSAID Bextra until it was removed from the market due to safety concerns. Bextra was effective but increased his blood pressure, leading to its discontinuation. He has received steroid injections in the past, which provided temporary relief and allowed him to perform activities like squats. Currently, he occasionally takes Celebrex, which he finds effective, but uses it sparingly, only when the pain becomes severe and his fingers 'seize up'.  He also has respiratory symptoms, including coughing and mucus production, present for the past four to five days. These symptoms have been severe enough to upset his stomach. He describes the issue as primarily involving sinus drainage, leading to chest congestion and some breathing difficulties. He has not used any over-the-counter medications for these symptoms yet. He recalls using an effective allergy medication while in [place], but does not have access to it now. He denies having a rescue inhaler and notes that his symptoms are not worse at night, as he uses a CPAP machine and sleeps well. The symptoms tend to worsen in the morning after waking up. No worsening of cough at night and no current use of nasal sprays. He experiences wheezing and tightness in breathing, particularly in the morning.         08/27/2023    8:05 AM 03/28/2023    2:04 PM 02/20/2023    7:54 AM  Depression screen PHQ 2/9  Decreased Interest 0 0 0  Down, Depressed, Hopeless 0 0 0  PHQ - 2 Score 0 0 0   Altered sleeping 0 0 0  Tired, decreased energy 1 0 0  Change in appetite 1 0 0  Feeling bad or failure about yourself  1 0 1  Trouble concentrating 0 0 0  Moving slowly or fidgety/restless 0 0 0  Suicidal thoughts 0 0 0  PHQ-9 Score 3 0 1  Difficult doing work/chores  Not difficult at all Not difficult at all       08/27/2023    8:05 AM 02/20/2023    8:00 AM 01/08/2023   10:42 AM 06/19/2022   12:01 PM  GAD 7 : Generalized Anxiety Score  Nervous, Anxious, on Edge 0 0 0 0  Control/stop worrying 0 0 0 0  Worry too much - different things 0 0 0 0  Trouble relaxing 0 0 0 0  Restless 0 0 0 0  Easily annoyed or irritable 0 0 0 0  Afraid - awful might happen 0 0 0 3  Total GAD 7 Score 0 0 0 3  Anxiety Difficulty  Not difficult at all Not difficult at all Somewhat difficult    Social History   Tobacco Use   Smoking status: Some Days    Current packs/day: 0.00    Average packs/day: 1 pack/day for 0.5 years (0.5 ttl pk-yrs)    Types: Cigarettes, Cigars    Start date: 04/05/1956    Last attempt to quit: 10/04/1956    Years since quitting: 66.9   Smokeless tobacco: Former   Tobacco  comments:    smoking cessation materials not required        "I haven't smoked cigarettes since I was 21.  I will smoke a cigar, every now and then." [facility] 08/02/2023  Vaping Use   Vaping status: Never Used  Substance Use Topics   Alcohol use: Yes    Alcohol/week: 28.0 standard drinks of alcohol    Types: 28 Shots of liquor per week    Comment: Scotch   Drug use: No    Review of Systems Per HPI unless specifically indicated above     Objective:    BP 138/70   Pulse 79   Ht 6\' 4"  (1.93 m)   Wt 258 lb (117 kg)   SpO2 96%   BMI 31.40 kg/m   Wt Readings from Last 3 Encounters:  09/16/23 258 lb (117 kg)  08/27/23 261 lb (118.4 kg)  08/13/23 261 lb (118.4 kg)    Physical Exam Vitals and nursing note reviewed.  Constitutional:      General: He is not in acute distress.    Appearance: He  is well-developed. He is not diaphoretic.     Comments: Well-appearing, comfortable, cooperative  HENT:     Head: Normocephalic and atraumatic.  Eyes:     General:        Right eye: No discharge.        Left eye: No discharge.     Conjunctiva/sclera: Conjunctivae normal.  Neck:     Thyroid: No thyromegaly.  Cardiovascular:     Rate and Rhythm: Normal rate and regular rhythm.     Pulses: Normal pulses.     Heart sounds: Normal heart sounds. No murmur heard. Pulmonary:     Effort: Pulmonary effort is normal. No respiratory distress.     Breath sounds: Wheezing (scattered mild wheeze) present. No rales.     Comments: cough Musculoskeletal:        General: Normal range of motion.     Cervical back: Normal range of motion and neck supple.  Lymphadenopathy:     Cervical: No cervical adenopathy.  Skin:    General: Skin is warm and dry.     Findings: No erythema or rash.  Neurological:     Mental Status: He is alert and oriented to person, place, and time. Mental status is at baseline.  Psychiatric:        Behavior: Behavior normal.     Comments: Well groomed, good eye contact, normal speech and thoughts     Results for orders placed or performed in visit on 08/21/23  Uric acid   Collection Time: 08/21/23  8:45 AM  Result Value Ref Range   Uric Acid, Serum 10.0 (H) 4.0 - 8.0 mg/dL  T4, free   Collection Time: 08/21/23  8:45 AM  Result Value Ref Range   Free T4 1.1 0.8 - 1.8 ng/dL  TSH   Collection Time: 08/21/23  8:45 AM  Result Value Ref Range   TSH 4.12 0.40 - 4.50 mIU/L  Lipid panel   Collection Time: 08/21/23  8:45 AM  Result Value Ref Range   Cholesterol 161 <200 mg/dL   HDL 60 > OR = 40 mg/dL   Triglycerides 981 <191 mg/dL   LDL Cholesterol (Calc) 79 mg/dL (calc)   Total CHOL/HDL Ratio 2.7 <5.0 (calc)   Non-HDL Cholesterol (Calc) 101 <130 mg/dL (calc)  Hemoglobin Y7W   Collection Time: 08/21/23  8:45 AM  Result Value Ref Range   Hgb A1c MFr Bld  5.5 <5.7 % of  total Hgb   Mean Plasma Glucose 111 mg/dL   eAG (mmol/L) 6.2 mmol/L      Assessment & Plan:   Problem List Items Addressed This Visit     Psoriatic arthritis (HCC)   Relevant Medications   celecoxib (CELEBREX) 100 MG capsule   Other Visit Diagnoses       Acute non-recurrent frontal sinusitis    -  Primary   Relevant Medications   fluticasone (FLONASE) 50 MCG/ACT nasal spray       Sinusitis Allergic Rhinitis with Sinus Congestion Sinus congestion and mucus production likely due to allergies. No bacterial infection or pneumonia. Symptoms worse in the morning. - Recommend OTC Mucinex for chest congestion. - Prescribe Flonase nasal spray for sinus congestion. - Advise OTC antihistamine such as Claritin, Zyrtec, or Allegra. - Instruct to contact office by end of week if symptoms do not improve or worsen for potential antibiotic prescription. - Discuss optional use of rescue inhaler if wheezing worsens.  Psoriatic Arthritis / Osteoarthritis multiple Joint Pain Chronic joint pain managed with NSAIDs. Celebrex used sparingly due to side effects concerns. Suitable alternative with flexible dosing. - Prescribe Celebrex 100 mg, take one up to two times a day as needed. - Provide 60-count bottle with refills.         No orders of the defined types were placed in this encounter.   Meds ordered this encounter  Medications   celecoxib (CELEBREX) 100 MG capsule    Sig: Take 1 capsule (100 mg total) by mouth 2 (two) times daily as needed for moderate pain (pain score 4-6) (arthritis).    Dispense:  60 capsule    Refill:  3   fluticasone (FLONASE) 50 MCG/ACT nasal spray    Sig: Place 2 sprays into both nostrils daily. Use for 4-6 weeks then stop and use seasonally or as needed.    Dispense:  16 g    Refill:  3    Follow up plan: Return if symptoms worsen or fail to improve.    Saralyn Pilar, DO [facility] 09/16/2023, 10:53  AM

## 2023-09-23 ENCOUNTER — Encounter: Payer: Self-pay | Admitting: Family Medicine

## 2023-09-23 ENCOUNTER — Ambulatory Visit (INDEPENDENT_AMBULATORY_CARE_PROVIDER_SITE_OTHER): Admitting: Family Medicine

## 2023-09-23 VITALS — BP 140/82 | HR 71 | Ht 76.0 in | Wt 253.0 lb

## 2023-09-23 DIAGNOSIS — J0110 Acute frontal sinusitis, unspecified: Secondary | ICD-10-CM

## 2023-09-23 DIAGNOSIS — J9801 Acute bronchospasm: Secondary | ICD-10-CM

## 2023-09-23 MED ORDER — AMOXICILLIN-POT CLAVULANATE 875-125 MG PO TABS: 1.0000 | ORAL_TABLET | Freq: Two times a day (BID) | ORAL | 0 refills | Status: DC

## 2023-09-23 MED ORDER — PREDNISONE 20 MG PO TABS: ORAL_TABLET | ORAL | 0 refills | Status: DC

## 2023-09-23 MED ORDER — ALBUTEROL SULFATE HFA 108 (90 BASE) MCG/ACT IN AERS: 2.0000 | INHALATION_SPRAY | Freq: Four times a day (QID) | RESPIRATORY_TRACT | 0 refills | Status: AC | PRN

## 2023-09-23 NOTE — Progress Notes (Unsigned)
 Subjective:    Patient ID: Daniel Brandt, male    DOB: 1934-05-27, 89 y.o.   MRN: [id number]  Daniel Brandt is a 89 y.o. male presenting on 09/23/2023 for Sinusitis and Cough (bronchospasm)   HPI  Discussed the use of AI scribe software for clinical note transcription with the patient, who gave verbal consent to proceed.  History of Present Illness   Daniel Brandt is an 89 year old male who presents with a persistent cough and wheezing.  He has been experiencing a persistent cough for the past three weeks, which initially seemed to improve but then returned with significant phlegm production. The cough is described as 'horrible' and temporarily dried up before worsening again. It is notably worse at night, and he has experienced chills. Initially, he thought it might be an allergy but now believes it could be something more persistent.  He has used a rescue inhaler a couple of times when symptoms worsened, particularly when nasal symptoms returned. He recalls having used prednisone in the past while in [place] but does not remember the details. He has not taken much prednisone recently.  The current cough has been more challenging than previous illnesses, including COVID, which he recovered from in a few days. He has a history of asthma-like symptoms triggered by viral infections, which have been exacerbated since the onset of COVID. The current symptoms are similar to an asthma response, with wheezing and difficulty breathing.  No fever. The cough is worse at night and is accompanied by wheezing.      ***   Health Maintenance: ***     08/27/2023    8:05 AM 03/28/2023    2:04 PM 02/20/2023    7:54 AM  Depression screen PHQ 2/9  Decreased Interest 0 0 0  Down, Depressed, Hopeless 0 0 0  PHQ - 2 Score 0 0 0  Altered sleeping 0 0 0  Tired, decreased energy 1 0 0  Change in appetite 1 0 0  Feeling bad or failure about yourself  1 0 1  Trouble concentrating 0 0 0  Moving  slowly or fidgety/restless 0 0 0  Suicidal thoughts 0 0 0  PHQ-9 Score 3 0 1  Difficult doing work/chores  Not difficult at all Not difficult at all       08/27/2023    8:05 AM 02/20/2023    8:00 AM 01/08/2023   10:42 AM 06/19/2022   12:01 PM  GAD 7 : Generalized Anxiety Score  Nervous, Anxious, on Edge 0 0 0 0  Control/stop worrying 0 0 0 0  Worry too much - different things 0 0 0 0  Trouble relaxing 0 0 0 0  Restless 0 0 0 0  Easily annoyed or irritable 0 0 0 0  Afraid - awful might happen 0 0 0 3  Total GAD 7 Score 0 0 0 3  Anxiety Difficulty  Not difficult at all Not difficult at all Somewhat difficult    Social History   Tobacco Use  . Smoking status: Some Days    Current packs/day: 0.00    Average packs/day: 1 pack/day for 0.5 years (0.5 ttl pk-yrs)    Types: Cigarettes, Cigars    Start date: 04/05/1956    Last attempt to quit: 10/04/1956    Years since quitting: 67.0  . Smokeless tobacco: Former  . Tobacco comments:    smoking cessation materials not required        "I haven't smoked cigarettes since I  was 21.  I will smoke a cigar, every now and then." [facility] 08/02/2023  Vaping Use  . Vaping status: Never Used  Substance Use Topics  . Alcohol use: Yes    Alcohol/week: 28.0 standard drinks of alcohol    Types: 28 Shots of liquor per week    Comment: Scotch  . Drug use: No    Review of Systems Per HPI unless specifically indicated above     Objective:    BP (!) 140/82   Pulse 71   Ht 6\' 4"  (1.93 m)   Wt 253 lb (114.8 kg)   SpO2 98%   BMI 30.80 kg/m   Wt Readings from Last 3 Encounters:  09/23/23 253 lb (114.8 kg)  09/16/23 258 lb (117 kg)  08/27/23 261 lb (118.4 kg)    Physical Exam  Results for orders placed or performed in visit on 08/21/23  Uric acid   Collection Time: 08/21/23  8:45 AM  Result Value Ref Range   Uric Acid, Serum 10.0 (H) 4.0 - 8.0 mg/dL  T4, free   Collection Time: 08/21/23  8:45 AM  Result Value Ref Range   Free T4 1.1 0.8  - 1.8 ng/dL  TSH   Collection Time: 08/21/23  8:45 AM  Result Value Ref Range   TSH 4.12 0.40 - 4.50 mIU/L  Lipid panel   Collection Time: 08/21/23  8:45 AM  Result Value Ref Range   Cholesterol 161 <200 mg/dL   HDL 60 > OR = 40 mg/dL   Triglycerides 102 <725 mg/dL   LDL Cholesterol (Calc) 79 mg/dL (calc)   Total CHOL/HDL Ratio 2.7 <5.0 (calc)   Non-HDL Cholesterol (Calc) 101 <130 mg/dL (calc)  Hemoglobin D6U   Collection Time: 08/21/23  8:45 AM  Result Value Ref Range   Hgb A1c MFr Bld 5.5 <5.7 % of total Hgb   Mean Plasma Glucose 111 mg/dL   eAG (mmol/L) 6.2 mmol/L      Assessment & Plan:   Problem List Items Addressed This Visit   None Visit Diagnoses       Acute non-recurrent frontal sinusitis    -  Primary   Relevant Medications   amoxicillin-clavulanate (AUGMENTIN) 875-125 MG tablet   predniSONE (DELTASONE) 20 MG tablet     Bronchospasm, acute       Relevant Medications   albuterol (VENTOLIN HFA) 108 (90 Base) MCG/ACT inhaler   predniSONE (DELTASONE) 20 MG tablet        Assessment and Plan    Persistent Cough with Wheezing Cough with wheezing suggests bronchospasm or asthma-like response, likely viral exacerbation. - Prescribe albuterol inhaler, two puffs as needed. - Prescribe prednisone, seven-day taper. - Prescribe sinus antibiotic. - Advise against NSAIDs while on prednisone.         No orders of the defined types were placed in this encounter.   Meds ordered this encounter  Medications  . amoxicillin-clavulanate (AUGMENTIN) 875-125 MG tablet    Sig: Take 1 tablet by mouth 2 (two) times daily.    Dispense:  20 tablet    Refill:  0  . albuterol (VENTOLIN HFA) 108 (90 Base) MCG/ACT inhaler    Sig: Inhale 2 puffs into the lungs every 6 (six) hours as needed for wheezing or shortness of breath.    Dispense:  8 g    Refill:  0  . predniSONE (DELTASONE) 20 MG tablet    Sig: Take daily with food. Start with 60mg  (3 pills) x 2 days, then reduce  to  40mg  (2 pills) x 2 days, then 20mg  (1 pill) x 3 days    Dispense:  13 tablet    Refill:  0    Follow up plan: Return if symptoms worsen or fail to improve.  ***Future labs ordered for ***  A total of *** (lvl 3, 4, 5) 15, 25, 40 minutes was spent face-to-face with this patient. Greater than 50% (approximately *** minutes) of this time was spent in counseling and coordination of care with the patient. ***  Saralyn Pilar, DO [facility] 09/23/2023, 3:40 PM

## 2023-09-23 NOTE — Patient Instructions (Addendum)
 Thank you for coming to the office today.   1. It sounds like you have a Sinusitis (Bacterial Infection) - this most likely started as an Upper Respiratory Virus that has settled into an infection. Allergies can also cause this. - Start Augmentin 1 pill twice daily (breakfast and dinner, with food and plenty of water) for 10 days, complete entire course, do not stop early even if feeling better  Rescue inhaler Albuterol 2 puff as needed for cough wheezing  Start Prednisone 7 day taper Do not take ibuprofen aleve advil celebrex on Prednisone  Start nasal steroid Flonase 2 sprays in each nostril daily for 4-6 weeks, may repeat course seasonally or as needed  - Recommend to keep using Nasal Saline spray multiple times a day to help flush out congestion and clear sinuses - Improve hydration by drinking plenty of clear fluids (water, gatorade) to reduce secretions and thin congestion - Congestion draining down throat can cause irritation. May try warm herbal tea with honey, cough drops - Can take Tylenol or Ibuprofen as needed for fevers - May continue over the counter cold medicine as you are, I would not use any decongestant or mucinex longer than 7 days.   Please schedule a Follow-up Appointment to: Return if symptoms worsen or fail to improve.  If you have any other questions or concerns, please feel free to call the office or send a message through MyChart. You may also schedule an earlier appointment if necessary.  Additionally, you may be receiving a survey about your experience at our office within a few days to 1 week by e-mail or mail. We value your feedback.  Saralyn Pilar, DO [facility], [place]

## 2023-11-22 ENCOUNTER — Ambulatory Visit: Admitting: Family Medicine

## 2023-11-27 ENCOUNTER — Encounter: Payer: Self-pay | Admitting: Family Medicine

## 2023-11-27 ENCOUNTER — Ambulatory Visit (INDEPENDENT_AMBULATORY_CARE_PROVIDER_SITE_OTHER): Admitting: Family Medicine

## 2023-11-27 VITALS — BP 138/70 | HR 68 | Ht 76.0 in | Wt 257.0 lb

## 2023-11-27 DIAGNOSIS — R0683 Snoring: Secondary | ICD-10-CM | POA: Diagnosis not present

## 2023-11-27 DIAGNOSIS — G4719 Other hypersomnia: Secondary | ICD-10-CM | POA: Diagnosis not present

## 2023-11-27 DIAGNOSIS — G4733 Obstructive sleep apnea (adult) (pediatric): Secondary | ICD-10-CM | POA: Diagnosis not present

## 2023-11-27 NOTE — Patient Instructions (Addendum)
 Thank you for coming to the office today.  [facility]  [email or web address]  Referral for Home Sleep Study Stay tuned to receive the testing equipment and then send it back and give us  time to review results and submit orders to [employer] for equipment for new CPAP  Typically we will need to see you again for follow-up within 30 to 90 days after you receive the CPAP equipment.  Please schedule a Follow-up Appointment to: Return if symptoms worsen or fail to improve.  If you have any other questions or concerns, please feel free to call the office or send a message through MyChart. You may also schedule an earlier appointment if necessary.  Additionally, you may be receiving a survey about your experience at our office within a few days to 1 week by e-mail or mail. We value your feedback.  Domingo Friend, DO [facility], [place]

## 2023-11-27 NOTE — Progress Notes (Signed)
 Subjective:    Patient ID: Daniel Brandt, male    DOB: 07-07-1934, 89 y.o.   MRN: [id number]  Daniel Brandt is a 89 y.o. male presenting on 11/27/2023 for Obstructive Sleep Apnea   HPI  Discussed the use of AI scribe software for clinical note transcription with the patient, who gave verbal consent to proceed.  History of Present Illness   Daniel Brandt is an 89 year old male with sleep apnea who presents for a replacement CPAP machine.  He has been diagnosed with OSA since 2003. He has been using his current CPAP machine since 2003, but it is no longer supported by the manufacturer, and parts are unavailable. He is seeking a replacement machine and mentions that a recent sleep study is required for insurance purposes to obtain a new CPAP machine.  Without the CPAP machine, he experiences apneic episodes during sleep, as observed by his wife. The machine has significantly reduced his snoring. He will feel tired during the day and doze off if not sleeping well. Daytime sleepiness.  His current CPAP machine uses 7 cm H2O pressure setting.   The original CPAP machine was provided by [employer], a medical supply company.   Epworth Sleepiness Scale Total Score: 12 Sitting and reading - 3 Watching TV - 3 Sitting inactive in a public place - 1 As a passenger in a car for an hour without a break - 2 Lying down to rest in the afternoon when circumstances permit - 3 Sitting and talking to someone - 0 Sitting quietly after a lunch without alcohol - 0 In a car, while stopped for a few minutes in traffic - 0     11/27/2023    2:16 PM 08/27/2023    8:05 AM 03/28/2023    2:04 PM  Depression screen PHQ 2/9  Decreased Interest 1 0 0  Down, Depressed, Hopeless 0 0 0  PHQ - 2 Score 1 0 0  Altered sleeping 0 0 0  Tired, decreased energy 1 1 0  Change in appetite 0 1 0  Feeling bad or failure about yourself  0 1 0  Trouble concentrating 0 0 0  Moving slowly or fidgety/restless 0 0 0   Suicidal thoughts 0 0 0  PHQ-9 Score 2 3 0  Difficult doing work/chores Somewhat difficult  Not difficult at all       11/27/2023    2:16 PM 08/27/2023    8:05 AM 02/20/2023    8:00 AM 01/08/2023   10:42 AM  GAD 7 : Generalized Anxiety Score  Nervous, Anxious, on Edge 0 0 0 0  Control/stop worrying 0 0 0 0  Worry too much - different things 0 0 0 0  Trouble relaxing 0 0 0 0  Restless 0 0 0 0  Easily annoyed or irritable 1 0 0 0  Afraid - awful might happen 0 0 0 0  Total GAD 7 Score 1 0 0 0  Anxiety Difficulty Not difficult at all  Not difficult at all Not difficult at all    Social History   Tobacco Use   Smoking status: Some Days    Current packs/day: 0.00    Average packs/day: 1 pack/day for 0.5 years (0.5 ttl pk-yrs)    Types: Cigarettes, Cigars    Start date: 04/05/1956    Last attempt to quit: 10/04/1956    Years since quitting: 67.1   Smokeless tobacco: Former   Tobacco comments:    smoking cessation materials not required        "  I haven't smoked cigarettes since I was 21.  I will smoke a cigar, every now and then." [facility] 08/02/2023  Vaping Use   Vaping status: Never Used  Substance Use Topics   Alcohol use: Yes    Alcohol/week: 28.0 standard drinks of alcohol    Types: 28 Shots of liquor per week    Comment: Scotch   Drug use: No    Review of Systems Per HPI unless specifically indicated above     Objective:     BP 138/70 (BP Location: Left Arm, Cuff Size: Normal)   Pulse 68   Ht 6\' 4"  (1.93 m)   Wt 257 lb (116.6 kg)   SpO2 99%   BMI 31.28 kg/m   Wt Readings from Last 3 Encounters:  11/27/23 257 lb (116.6 kg)  09/23/23 253 lb (114.8 kg)  09/16/23 258 lb (117 kg)    Physical Exam Vitals and nursing note reviewed.  Constitutional:      General: He is not in acute distress.    Appearance: Normal appearance. He is well-developed. He is not diaphoretic.     Comments: Well-appearing, comfortable, cooperative  HENT:     Head: Normocephalic and  atraumatic.  Eyes:     General:        Right eye: No discharge.        Left eye: No discharge.     Conjunctiva/sclera: Conjunctivae normal.  Cardiovascular:     Rate and Rhythm: Normal rate.  Pulmonary:     Effort: Pulmonary effort is normal.  Skin:    General: Skin is warm and dry.     Findings: No erythema or rash.  Neurological:     Mental Status: He is alert and oriented to person, place, and time.  Psychiatric:        Mood and Affect: Mood normal.        Behavior: Behavior normal.        Thought Content: Thought content normal.     Comments: Well groomed, good eye contact, normal speech and thoughts     Results for orders placed or performed in visit on 08/21/23  Uric acid   Collection Time: 08/21/23  8:45 AM  Result Value Ref Range   Uric Acid, Serum 10.0 (H) 4.0 - 8.0 mg/dL  T4, free   Collection Time: 08/21/23  8:45 AM  Result Value Ref Range   Free T4 1.1 0.8 - 1.8 ng/dL  TSH   Collection Time: 08/21/23  8:45 AM  Result Value Ref Range   TSH 4.12 0.40 - 4.50 mIU/L  Lipid panel   Collection Time: 08/21/23  8:45 AM  Result Value Ref Range   Cholesterol 161 <200 mg/dL   HDL 60 > OR = 40 mg/dL   Triglycerides 161 <096 mg/dL   LDL Cholesterol (Calc) 79 mg/dL (calc)   Total CHOL/HDL Ratio 2.7 <5.0 (calc)   Non-HDL Cholesterol (Calc) 101 <130 mg/dL (calc)  Hemoglobin E4V   Collection Time: 08/21/23  8:45 AM  Result Value Ref Range   Hgb A1c MFr Bld 5.5 <5.7 % of total Hgb   Mean Plasma Glucose 111 mg/dL   eAG (mmol/L) 6.2 mmol/L      Assessment & Plan:   Problem List Items Addressed This Visit   None Visit Diagnoses       OSA (obstructive sleep apnea)    -  Primary   Relevant Orders   Home sleep test     Snoring  Excessive daytime sleepiness            Obstructive Sleep Apnea (OSA) on CPAP Chronic sleep apnea managed with CPAP since 2003 (last sleep study) Current machine outdated and unsupported. No daytime sleepiness reported, but  apneic episodes observed without CPAP. Home sleep study preferred for convenience and cost.  - Order home sleep study through [employer]. - Coordinate with [facility] for new CPAP equipment post-study. - Provided printed information about process and contact details.        Orders Placed This Encounter  Procedures   Home sleep test    [facility], HST. He has known OSA. Last sleep study and CPAP from 2003. He will need repeat HST sleep study in order to get new ordered CPAP machine.    Where should this test be performed::   Other    No orders of the defined types were placed in this encounter.   Follow up plan: Return if symptoms worsen or fail to improve.    Domingo Friend, DO [facility] 11/27/2023, 2:42 PM

## 2023-12-12 DIAGNOSIS — G4730 Sleep apnea, unspecified: Secondary | ICD-10-CM | POA: Diagnosis not present

## 2023-12-20 ENCOUNTER — Other Ambulatory Visit: Payer: Self-pay | Admitting: Family Medicine

## 2023-12-20 DIAGNOSIS — J0110 Acute frontal sinusitis, unspecified: Secondary | ICD-10-CM

## 2023-12-23 NOTE — Telephone Encounter (Signed)
 Requested Prescriptions  Pending Prescriptions Disp Refills   fluticasone  (FLONASE ) 50 MCG/ACT nasal spray [Pharmacy Med Name: FLUTICASONE  NASAL SP (120) RX] 48 g 0    Sig: USE 2 SPRAYS IN EACH NOSTRIL EVERY DAY FOR 4-6 WEEKS THEN STOP AND USE SEASONALLY OR AS NEEDED     Ear, Nose, and Throat: Nasal Preparations - Corticosteroids Passed - 12/23/2023  1:22 PM      Passed - Valid encounter within last 12 months    Recent Outpatient Visits           3 weeks ago OSA (obstructive sleep apnea)   [place] [facility] [place], Kayleen Party, DO   3 months ago Acute non-recurrent frontal sinusitis   [facility] [place], Kayleen Party, DO   3 months ago Acute non-recurrent frontal sinusitis   [place] [facility] [place], Kayleen Party, DO   3 months ago Essential hypertension   [place] [facility] Raina Bunting, DO   4 months ago Essential hypertension   [facility] [place], Kayleen Party, DO       Future Appointments             In 2 months [place], Kayleen Party, DO [place] [facility], [facility]

## 2023-12-24 ENCOUNTER — Other Ambulatory Visit: Payer: Self-pay | Admitting: Family Medicine

## 2023-12-24 DIAGNOSIS — G4733 Obstructive sleep apnea (adult) (pediatric): Secondary | ICD-10-CM

## 2024-01-01 ENCOUNTER — Telehealth: Payer: Self-pay | Admitting: Family Medicine

## 2024-01-01 NOTE — Telephone Encounter (Signed)
 Can you contact [facility] and request copy of his last sleep study? We will need to fax all orders for CPAP to a different company, [facility] is out of network for this patient.  Once we receive sleep study results, will need to print new DME order for CPAP and last office visit.  We can plan to fax to [facility] and see if they can full fill his CPAP  [facility] Address: [address], [place], [place] [address] Phone: [phone] Fax: [phone]  Marsa Officer, DO [facility] 01/01/2024, 5:37 PM

## 2024-01-02 NOTE — Telephone Encounter (Signed)
 Spoke with [place], she will refax the sleep study

## 2024-01-02 NOTE — Telephone Encounter (Signed)
 Printed last office visit and insurance info and completed CPAP order. To be faxed to [facility]  Marsa Officer, DO [facility] 01/02/2024, 1:18 PM

## 2024-01-20 ENCOUNTER — Encounter: Payer: Self-pay | Admitting: Family Medicine

## 2024-01-20 NOTE — Telephone Encounter (Signed)
 Order was faxed to [facility] on 6/26.  Can we call [facility] and check status of this CPAP order?

## 2024-02-17 ENCOUNTER — Telehealth: Payer: Self-pay

## 2024-02-17 NOTE — Telephone Encounter (Signed)
 Copied from CRM [id number]. Topic: Clinical - Prescription Issue >> Feb 17, 2024  1:03 PM Nathanel BROCKS wrote: Reason for CRM: presciption for cpap machine and supplies. [facility] is calling to check on fax of prescription for this. Please call and confirm and send request back in. [phone]

## 2024-02-18 NOTE — Telephone Encounter (Signed)
 Tried calling patient no answer or VM    Calling patient to see if he has gotten a new CPAP

## 2024-02-25 ENCOUNTER — Other Ambulatory Visit: Payer: Medicare Other

## 2024-02-27 ENCOUNTER — Other Ambulatory Visit: Payer: Self-pay | Admitting: Family Medicine

## 2024-02-27 DIAGNOSIS — I10 Essential (primary) hypertension: Secondary | ICD-10-CM

## 2024-02-28 NOTE — Telephone Encounter (Signed)
 Requested Prescriptions  Pending Prescriptions Disp Refills   olmesartan  (BENICAR ) 40 MG tablet [Pharmacy Med Name: OLMESARTAN  MEDOXOMIL 40MG  TABLETS] 90 tablet 0    Sig: TAKE 1 TABLET(40 MG) BY MOUTH DAILY     Cardiovascular:  Angiotensin Receptor Blockers Failed - 02/28/2024  8:25 AM      Failed - Cr in normal range and within 180 days    Creat  Date Value Ref Range Status  02/15/2023 1.13 0.70 - 1.22 mg/dL Final         Failed - K in normal range and within 180 days    Potassium  Date Value Ref Range Status  02/15/2023 4.2 3.5 - 5.3 mmol/L Final         Passed - Patient is not pregnant      Passed - Last BP in normal range    BP Readings from Last 1 Encounters:  11/27/23 138/70         Passed - Valid encounter within last 6 months    Recent Outpatient Visits           3 months ago OSA (obstructive sleep apnea)   [place] [facility] [place] Marsa PARAS, DO   5 months ago Acute non-recurrent frontal sinusitis   [place] [facility] [place], Marsa PARAS, DO   5 months ago Acute non-recurrent frontal sinusitis   [place] [facility] [place], Marsa PARAS, DO   6 months ago Essential hypertension   [place] [facility] [place] Marsa PARAS, DO   6 months ago Essential hypertension   [place] [facility] [place], Marsa PARAS, DO       Future Appointments             In 4 days [place], Marsa PARAS, DO [facility], [facility]

## 2024-03-03 ENCOUNTER — Encounter: Payer: Self-pay | Admitting: Family Medicine

## 2024-03-03 ENCOUNTER — Ambulatory Visit (INDEPENDENT_AMBULATORY_CARE_PROVIDER_SITE_OTHER): Payer: Medicare Other | Admitting: Family Medicine

## 2024-03-03 VITALS — BP 122/60 | HR 86 | Ht 76.0 in | Wt 246.4 lb

## 2024-03-03 DIAGNOSIS — E781 Pure hyperglyceridemia: Secondary | ICD-10-CM

## 2024-03-03 DIAGNOSIS — I10 Essential (primary) hypertension: Secondary | ICD-10-CM

## 2024-03-03 DIAGNOSIS — E039 Hypothyroidism, unspecified: Secondary | ICD-10-CM | POA: Diagnosis not present

## 2024-03-03 DIAGNOSIS — N183 Chronic kidney disease, stage 3 unspecified: Secondary | ICD-10-CM

## 2024-03-03 DIAGNOSIS — G4733 Obstructive sleep apnea (adult) (pediatric): Secondary | ICD-10-CM

## 2024-03-03 DIAGNOSIS — R7989 Other specified abnormal findings of blood chemistry: Secondary | ICD-10-CM | POA: Diagnosis not present

## 2024-03-03 DIAGNOSIS — I129 Hypertensive chronic kidney disease with stage 1 through stage 4 chronic kidney disease, or unspecified chronic kidney disease: Secondary | ICD-10-CM

## 2024-03-03 DIAGNOSIS — E790 Hyperuricemia without signs of inflammatory arthritis and tophaceous disease: Secondary | ICD-10-CM

## 2024-03-03 DIAGNOSIS — Z0000 Encounter for general adult medical examination without abnormal findings: Secondary | ICD-10-CM | POA: Diagnosis not present

## 2024-03-03 DIAGNOSIS — R7309 Other abnormal glucose: Secondary | ICD-10-CM | POA: Diagnosis not present

## 2024-03-03 DIAGNOSIS — I482 Chronic atrial fibrillation, unspecified: Secondary | ICD-10-CM | POA: Diagnosis not present

## 2024-03-03 DIAGNOSIS — R351 Nocturia: Secondary | ICD-10-CM

## 2024-03-03 MED ORDER — LEVOTHYROXINE SODIUM 25 MCG PO TABS: 25.0000 ug | ORAL_TABLET | Freq: Every day | ORAL | 3 refills | Status: AC

## 2024-03-03 MED ORDER — METOPROLOL SUCCINATE ER 50 MG PO TB24: 50.0000 mg | ORAL_TABLET | Freq: Every day | ORAL | 3 refills | Status: AC

## 2024-03-03 MED ORDER — OLMESARTAN MEDOXOMIL 40 MG PO TABS: 40.0000 mg | ORAL_TABLET | Freq: Every day | ORAL | 3 refills | Status: AC

## 2024-03-03 NOTE — Patient Instructions (Addendum)
 Thank you for coming to the office today.  Labs today  We will fax CPAP Compliance Certification  Refills sent to pharmacy.  DUE for FASTING BLOOD WORK (no food or drink after midnight before the lab appointment, only water or coffee without cream/sugar on the morning of)  For Lab Results, once available within 2-3 days of blood draw, you can can log in to MyChart online to view your results and a brief explanation. Also, we can discuss results at next follow-up visit.   Please schedule a Follow-up Appointment to: Return for 1 year Annual Physical, AM fasting labs AFTER.  If you have any other questions or concerns, please feel free to call the office or send a message through MyChart. You may also schedule an earlier appointment if necessary.  Additionally, you may be receiving a survey about your experience at our office within a few days to 1 week by e-mail or mail. We value your feedback.  Marsa Officer, DO [facility] [place], [place]

## 2024-03-03 NOTE — Progress Notes (Signed)
 Subjective:    Patient ID: Daniel Brandt, male    DOB: 11-Jan-1934, 89 y.o.   MRN: [id number]  Daniel Brandt is a 89 y.o. male presenting on 03/03/2024 for Annual Exam   HPI  Discussed the use of AI scribe software for clinical note transcription with the patient, who gave verbal consent to proceed.  History of Present Illness   Daniel Brandt is an 89 year old male who presents for an annual physical exam.  OSA on CPAP Completed [facility] HST and now has CPAP from [facility] - Uses CPAP machine regularly, except for a 10-day trip to [place] without use - Believes he has met the compliance requirement of using CPAP 70% of nights - Improved on CPAP therapy and benefits from therapy  Arthralgia and chronic gout - Ongoing arthritis symptoms, particularly in finger joints with deformity and also foot with 2nd toe R side with significant tophaceous gout deformity. - Development of small nodular growths on finger joints with reduced mobility - Right foot with changes in appearance - Family history of gout (sister affected) - Uses Celebrex  sparingly for pain, avoids consecutive days  Allergic symptoms - Frequent allergies with current symptoms of mucus production - No sneezing - Previous evaluation at allergy clinic twice without definitive diagnosis - Diminished effectiveness of prior allergy treatments over time  Hypothyroidism On levothyroxine  25mcg daily, needs labs  Hypertension On Metoprolol  XL 50mg  daily  Vaccine adverse reactions and hesitancy - History of adverse reactions to vaccines - Declined further influenza and pneumococcal vaccines - Received only the initial COVID-19 vaccine     HyperTriglyceridemia His cholesterol levels have shown notable improvement,  Improved TG on Omega 3 Not on Statin  Elevated A1c Due for repeat labs. Previously His A1c level has decreased to 5.5, indicating a good three-month average blood  sugar level, and he is far from the diabetic range. He feels relieved as he has seen the severe consequences of diabetes in others.    Health Maintenance:  Previous Pneumonia vaccines 2017 and 2019, Due for Prevnar-20 considering it  Decline Flu COVID     03/03/2024    8:05 AM 11/27/2023    2:16 PM 08/27/2023    8:05 AM  Depression screen PHQ 2/9  Decreased Interest 0 1 0  Down, Depressed, Hopeless 0 0 0  PHQ - 2 Score 0 1 0  Altered sleeping 0 0 0  Tired, decreased energy 1 1 1   Change in appetite 0 0 1  Feeling bad or failure about yourself  0 0 1  Trouble concentrating 0 0 0  Moving slowly or fidgety/restless 0 0 0  Suicidal thoughts 0 0 0  PHQ-9 Score 1 2 3   Difficult doing work/chores Not difficult at all Somewhat difficult        03/03/2024    8:05 AM 11/27/2023    2:16 PM 08/27/2023    8:05 AM 02/20/2023    8:00 AM  GAD 7 : Generalized Anxiety Score  Nervous, Anxious, on Edge 0 0 0 0  Control/stop worrying 0 0 0 0  Worry too much - different things 0 0 0 0  Trouble relaxing 0 0 0 0  Restless 0 0 0 0  Easily annoyed or irritable 0 1 0 0  Afraid - awful might happen 0 0 0 0  Total GAD 7 Score 0 1 0 0  Anxiety Difficulty  Not difficult at all  Not difficult at all  Past Medical History:  Diagnosis Date   A-fib [facility])    Arthritis    FH: heart failure 10/04/2016   Former smoker    Heart murmur    Hypertension    Malaria    Stroke [facility])    TB lung, latent    Completed 4 months of LTBI tx with Rifampin  ~09/09/2020   Past Surgical History:  Procedure Laterality Date   COLONOSCOPY  06/12/2016   FRACTURE SURGERY     KNEE REPLACE Bilateral 2017   LUMBAR FUSION  1995   L3 L4   Social History   Socioeconomic History   Marital status: Married    Spouse name: Eustace Hur   Number of children: 6   Years of education: Not on file   Highest education level: Master's degree (e.g., MA, MS, MEng, MEd, MSW, MBA)  Occupational History   Occupation: Retired   Tobacco Use   Smoking status: Some Days    Current packs/day: 0.00    Average packs/day: 1 pack/day for 0.5 years (0.5 ttl pk-yrs)    Types: Cigarettes, Cigars    Start date: 04/05/1956    Last attempt to quit: 10/04/1956    Years since quitting: 67.4   Smokeless tobacco: Former   Tobacco comments:    smoking cessation materials not required        I haven't smoked cigarettes since I was 21.  I will smoke a cigar, every now and then. DJM 08/02/2023  Vaping Use   Vaping status: Never Used  Substance and Sexual Activity   Alcohol use: Yes    Alcohol/week: 28.0 standard drinks of alcohol    Types: 28 Shots of liquor per week    Comment: Scotch   Drug use: No   Sexual activity: Not Currently  Other Topics Concern   Not on file  Social History Narrative   Not on file   Social Drivers of Health   Financial Resource Strain: Low Risk  (08/13/2023)   Overall Financial Resource Strain (CARDIA)    Difficulty of Paying Living Expenses: Not hard at all  Food Insecurity: No Food Insecurity (08/13/2023)   Hunger Vital Sign    Worried About Running Out of Food in the Last Year: Never true    Ran Out of Food in the Last Year: Never true  Transportation Needs: No Transportation Needs (08/13/2023)   PRAPARE - Administrator, Civil Service (Medical): No    Lack of Transportation (Non-Medical): No  Physical Activity: Sufficiently Active (08/13/2023)   Exercise Vital Sign    Days of Exercise per Week: 4 days    Minutes of Exercise per Session: 50 min  Stress: No Stress Concern Present (08/13/2023)   [employer] of Occupational Health - Occupational Stress Questionnaire    Feeling of Stress : Not at all  Social Connections: Moderately Integrated (08/13/2023)   Social Connection and Isolation Panel    Frequency of Communication with Friends and Family: Twice a week    Frequency of Social Gatherings with Friends and Family: Three times a week    Attends Religious Services: Never     Active Member of Clubs or Organizations: Yes    Attends Banker Meetings: 1 to 4 times per year    Marital Status: Married  Catering manager Violence: Not At Risk (03/28/2023)   Humiliation, Afraid, Rape, and Kick questionnaire    Fear of Current or Ex-Partner: No    Emotionally Abused: No    Physically Abused:  No    Sexually Abused: No   Family History  Problem Relation Age of Onset   Heart disease Mother    Heart disease Father    Gout Sister    Heart attack Son    Current Outpatient Medications on File Prior to Visit  Medication Sig   albuterol  (VENTOLIN  HFA) 108 (90 Base) MCG/ACT inhaler Inhale 2 puffs into the lungs every 6 (six) hours as needed for wheezing or shortness of breath.   aspirin  81 MG tablet Take 1 tablet (81 mg total) by mouth daily.   celecoxib  (CELEBREX ) 100 MG capsule Take 1 capsule (100 mg total) by mouth 2 (two) times daily as needed for moderate pain (pain score 4-6) (arthritis).   fluticasone  (FLONASE ) 50 MCG/ACT nasal spray USE 2 SPRAYS IN EACH NOSTRIL EVERY DAY FOR 4-6 WEEKS THEN STOP AND USE SEASONALLY OR AS NEEDED   Multiple Vitamins-Minerals (MULTIVITAMIN WITH MINERALS) tablet Take 1 tablet by mouth daily.   NON FORMULARY CPAP nightly   Omega-3 Fatty Acids (FISH OIL) 1000 MG CAPS Take 3 capsules by mouth 2 (two) times daily.   triamcinolone  0.1%-Cerave equivalent 1:1 cream mixture Apply topically 2 (two) times daily as needed.   vitamin B-12 (CYANOCOBALAMIN ) 500 MCG tablet Take 500 mcg by mouth daily.   No current facility-administered medications on file prior to visit.    Review of Systems  Constitutional:  Negative for activity change, appetite change, chills, diaphoresis, fatigue and fever.  HENT:  Negative for congestion and hearing loss.   Eyes:  Negative for visual disturbance.  Respiratory:  Negative for cough, chest tightness, shortness of breath and wheezing.   Cardiovascular:  Negative for chest pain, palpitations and leg  swelling.  Gastrointestinal:  Negative for abdominal pain, constipation, diarrhea, nausea and vomiting.  Genitourinary:  Negative for dysuria, frequency and hematuria.  Musculoskeletal:  Negative for arthralgias and neck pain.  Skin:  Negative for rash.  Neurological:  Negative for dizziness, weakness, light-headedness, numbness and headaches.  Hematological:  Negative for adenopathy.  Psychiatric/Behavioral:  Negative for behavioral problems, dysphoric mood and sleep disturbance.    Per HPI unless specifically indicated above     Objective:    BP 122/60 (BP Location: Left Arm, Patient Position: Sitting, Cuff Size: Normal)   Pulse 86   Ht 6' 4 (1.93 m)   Wt 246 lb 6 oz (111.8 kg)   SpO2 97%   BMI 29.99 kg/m   Wt Readings from Last 3 Encounters:  03/03/24 246 lb 6 oz (111.8 kg)  11/27/23 257 lb (116.6 kg)  09/23/23 253 lb (114.8 kg)    Physical Exam Vitals and nursing note reviewed.  Constitutional:      General: He is not in acute distress.    Appearance: He is well-developed. He is not diaphoretic.     Comments: Well-appearing, comfortable, cooperative  HENT:     Head: Normocephalic and atraumatic.  Eyes:     General:        Right eye: No discharge.        Left eye: No discharge.     Conjunctiva/sclera: Conjunctivae normal.     Pupils: Pupils are equal, round, and reactive to light.  Neck:     Thyroid : No thyromegaly.     Vascular: No carotid bruit.  Cardiovascular:     Rate and Rhythm: Normal rate and regular rhythm.     Pulses: Normal pulses.     Heart sounds: Normal heart sounds. No murmur heard. Pulmonary:  Effort: Pulmonary effort is normal. No respiratory distress.     Breath sounds: Normal breath sounds. No wheezing or rales.  Abdominal:     General: Bowel sounds are normal. There is no distension.     Palpations: Abdomen is soft. There is no mass.     Tenderness: There is no abdominal tenderness.  Musculoskeletal:        General: No tenderness.  Normal range of motion.     Cervical back: Normal range of motion and neck supple.     Right lower leg: No edema.     Left lower leg: No edema.     Comments: Upper / Lower Extremities: - Normal muscle tone, strength bilateral upper extremities 5/5, lower extremities 5/5  Bilateral fingers with some deviation of PIP joints  R foot and R 2nd toe with significant tophaceous gout deformity. Non tender. Chronic problem.  Lymphadenopathy:     Cervical: No cervical adenopathy.  Skin:    General: Skin is warm and dry.     Findings: No erythema or rash.  Neurological:     Mental Status: He is alert and oriented to person, place, and time.     Comments: Distal sensation intact to light touch all extremities  Psychiatric:        Mood and Affect: Mood normal.        Behavior: Behavior normal.        Thought Content: Thought content normal.     Comments: Well groomed, good eye contact, normal speech and thoughts     Results for orders placed or performed in visit on 08/21/23  Uric acid   Collection Time: 08/21/23  8:45 AM  Result Value Ref Range   Uric Acid, Serum 10.0 (H) 4.0 - 8.0 mg/dL  T4, free   Collection Time: 08/21/23  8:45 AM  Result Value Ref Range   Free T4 1.1 0.8 - 1.8 ng/dL  TSH   Collection Time: 08/21/23  8:45 AM  Result Value Ref Range   TSH 4.12 0.40 - 4.50 mIU/L  Lipid panel   Collection Time: 08/21/23  8:45 AM  Result Value Ref Range   Cholesterol 161 <200 mg/dL   HDL 60 > OR = 40 mg/dL   Triglycerides 872 <849 mg/dL   LDL Cholesterol (Calc) 79 mg/dL (calc)   Total CHOL/HDL Ratio 2.7 <5.0 (calc)   Non-HDL Cholesterol (Calc) 101 <130 mg/dL (calc)  Hemoglobin J8r   Collection Time: 08/21/23  8:45 AM  Result Value Ref Range   Hgb A1c MFr Bld 5.5 <5.7 % of total Hgb   Mean Plasma Glucose 111 mg/dL   eAG (mmol/L) 6.2 mmol/L      Assessment & Plan:   Problem List Items Addressed This Visit     Benign hypertension with CKD (chronic kidney disease) stage III  (HCC)   Relevant Medications   olmesartan  (BENICAR ) 40 MG tablet   metoprolol  succinate (TOPROL -XL) 50 MG 24 hr tablet   Chronic atrial fibrillation   Relevant Medications   olmesartan  (BENICAR ) 40 MG tablet   metoprolol  succinate (TOPROL -XL) 50 MG 24 hr tablet   OSA on CPAP   Other Visit Diagnoses       Annual physical exam    -  Primary     Essential hypertension       Relevant Medications   olmesartan  (BENICAR ) 40 MG tablet   metoprolol  succinate (TOPROL -XL) 50 MG 24 hr tablet     Hypertriglyceridemia  Relevant Medications   olmesartan  (BENICAR ) 40 MG tablet   metoprolol  succinate (TOPROL -XL) 50 MG 24 hr tablet     Elevated TSH         Abnormal glucose         Elevated uric acid in blood         Nocturia         Acquired hypothyroidism       Relevant Medications   metoprolol  succinate (TOPROL -XL) 50 MG 24 hr tablet   levothyroxine  (SYNTHROID ) 25 MCG tablet        Updated Health Maintenance information Fasting labs ordered today Encouraged improvement to lifestyle with diet and exercise Goal of weight loss  Adult Wellness Visit Annual wellness visit conducted. Blood work pending. No new health concerns reported. - Order blood tests: thyroid , prostate, blood count, kidney function, glucose, cholesterol, uric acid levels. - Refill medications: metoprolol , levothyroxine , Celebrex . - Document refusal of flu and pneumonia vaccines. - Schedule next annual wellness visit in one year.  Hypertension Blood pressure managed with metoprolol . Previous adverse reaction to Baxter noted. Current regimen effective. - Continue metoprolol  for blood pressure management.  Hypothyroidism Managed with levothyroxine . No new symptoms or concerns reported. Pending Free T4, TSH initially denied by insurance, we will submit new code - Continue levothyroxine  25mcg daily for thyroid  management.  OSA on CPAP Recently completed updated [facility] HST to upgrade his home CPAP  machine [facility] for DME Using CPAP device regularly. Compliance with usage requirements likely met, based on >70% days used and >4 hours per night he tolerates CPAP well - Send certification to [facility] for CPAP compliance.  Follow-up now 30-90 days for compliance Adjusting to different masks for comfort. Better sleep quality reported. - Continue CPAP therapy. - He is compliant and using CPAP with nightly adherence. - He tolerates CPAP therapy well and he benefits from CPAP therapy  CPAP Compliance Certification  [facility] Address: [address], [place], [place] [address] Phone: [phone] Fax: [phone]  Primary osteoarthritis of multiple sites and gout Chronic osteoarthritis with joint deformities. Gout suspected due to joint deposits. No significant pain, reduced mobility noted. - Monitor joint symptoms and mobility. Discussed may consider Podiatry for chronic gout of foot, however a lot of his joint destruction appears chronic stable, and non painful, may not have simple solution to resolve it  Allergic rhinitis Chronic allergies with mild symptoms. Previous treatments less effective. - Continue current allergy management with antihistamines and nasal sprays as needed.        No orders of the defined types were placed in this encounter.   Meds ordered this encounter  Medications   olmesartan  (BENICAR ) 40 MG tablet    Sig: Take 1 tablet (40 mg total) by mouth daily.    Dispense:  90 tablet    Refill:  3    Add for future refills   metoprolol  succinate (TOPROL -XL) 50 MG 24 hr tablet    Sig: Take 1 tablet (50 mg total) by mouth daily. Take with or immediately following a meal.    Dispense:  90 tablet    Refill:  3    Add future refills   levothyroxine  (SYNTHROID ) 25 MCG tablet    Sig: Take 1 tablet (25 mcg total) by mouth daily before breakfast.    Dispense:  90 tablet    Refill:  3     Add for future refills  Follow up plan: Return for 1 year Annual Physical, AM fasting labs AFTER.  Marsa Officer, DO [facility] 03/03/2024, 8:11 AM

## 2024-03-04 LAB — CBC WITH DIFFERENTIAL/PLATELET
Absolute Lymphocytes: 1279 {cells}/uL (ref 850–3900)
Absolute Monocytes: 426 {cells}/uL (ref 200–950)
Basophils Absolute: 21 {cells}/uL (ref 0–200)
Basophils Relative: 0.5 %
Eosinophils Absolute: 279 {cells}/uL (ref 15–500)
Eosinophils Relative: 6.8 %
HCT: 43.5 % (ref 38.5–50.0)
Hemoglobin: 14.6 g/dL (ref 13.2–17.1)
MCH: 36 pg — ABNORMAL HIGH (ref 27.0–33.0)
MCHC: 33.6 g/dL (ref 32.0–36.0)
MCV: 107.1 fL — ABNORMAL HIGH (ref 80.0–100.0)
MPV: 9 fL (ref 7.5–12.5)
Monocytes Relative: 10.4 %
Neutro Abs: 2095 {cells}/uL (ref 1500–7800)
Neutrophils Relative %: 51.1 %
Platelets: 232 Thousand/uL (ref 140–400)
RBC: 4.06 Million/uL — ABNORMAL LOW (ref 4.20–5.80)
RDW: 12.3 % (ref 11.0–15.0)
Total Lymphocyte: 31.2 %
WBC: 4.1 Thousand/uL (ref 3.8–10.8)

## 2024-03-04 LAB — LIPID PANEL
Cholesterol: 154 mg/dL (ref <200)
HDL: 43 mg/dL (ref >=40)
LDL Cholesterol (Calc): 82 mg/dL
Non-HDL Cholesterol (Calc): 111 mg/dL (ref <130)
Total CHOL/HDL Ratio: 3.6 (calc) (ref <5.0)
Triglycerides: 194 mg/dL — ABNORMAL HIGH (ref <150)

## 2024-03-04 LAB — COMPLETE METABOLIC PANEL WITHOUT GFR
AG Ratio: 2.2 (calc) (ref 1.0–2.5)
ALT: 51 U/L — ABNORMAL HIGH (ref 9–46)
AST: 37 U/L — ABNORMAL HIGH (ref 10–35)
Albumin: 4.4 g/dL (ref 3.6–5.1)
Alkaline phosphatase (APISO): 47 U/L (ref 35–144)
BUN: 15 mg/dL (ref 7–25)
CO2: 25 mmol/L (ref 20–32)
Calcium: 9.3 mg/dL (ref 8.6–10.3)
Chloride: 102 mmol/L (ref 98–110)
Creat: 1.16 mg/dL (ref 0.70–1.22)
Globulin: 2 g/dL (ref 1.9–3.7)
Glucose, Bld: 91 mg/dL (ref 65–99)
Potassium: 4.7 mmol/L (ref 3.5–5.3)
Sodium: 137 mmol/L (ref 135–146)
Total Bilirubin: 0.8 mg/dL (ref 0.2–1.2)
Total Protein: 6.4 g/dL (ref 6.1–8.1)

## 2024-03-04 LAB — PSA: PSA: 0.9 ng/mL (ref <=4.00)

## 2024-03-04 LAB — T4, FREE: Free T4: 1.2 ng/dL (ref 0.8–1.8)

## 2024-03-04 LAB — TEST AUTHORIZATION

## 2024-03-04 LAB — TSH: TSH: 4.9 m[IU]/L — ABNORMAL HIGH (ref 0.40–4.50)

## 2024-03-04 LAB — URIC ACID: Uric Acid, Serum: 8.3 mg/dL — ABNORMAL HIGH (ref 4.0–8.0)

## 2024-03-04 LAB — HEMOGLOBIN A1C
Hgb A1c MFr Bld: 5.8 % — ABNORMAL HIGH (ref <5.7)
Mean Plasma Glucose: 120 mg/dL
eAG (mmol/L): 6.6 mmol/L

## 2024-03-05 ENCOUNTER — Ambulatory Visit: Payer: Self-pay | Admitting: Family Medicine

## 2024-03-05 DIAGNOSIS — M1A0710 Idiopathic chronic gout, right ankle and foot, without tophus (tophi): Secondary | ICD-10-CM

## 2024-03-19 ENCOUNTER — Telehealth: Payer: Self-pay

## 2024-03-19 NOTE — Telephone Encounter (Signed)
 Spoke with Delon at [facility] (CPAP equipment), they have been trying to contact the patient to set up equipment. But havent been able to speak with [name].   I spoke with Slayter and gave him the contact information for [facility] [phone]. Instructed him to call them to get set up with new CPAP equipment.

## 2024-03-30 MED ORDER — ALLOPURINOL 100 MG PO TABS: 100.0000 mg | ORAL_TABLET | Freq: Every day | ORAL | 2 refills | Status: DC

## 2024-04-03 ENCOUNTER — Ambulatory Visit: Payer: Medicare Other

## 2024-04-03 DIAGNOSIS — Z0000 Encounter for general adult medical examination without abnormal findings: Secondary | ICD-10-CM | POA: Diagnosis not present

## 2024-04-03 NOTE — Progress Notes (Signed)
 Subjective:   Daniel Brandt is a 89 y.o. who presents for a Medicare Wellness preventive visit.  As a reminder, Annual Wellness Visits don't include a physical exam, and some assessments may be limited, especially if this visit is performed virtually. We may recommend an in-person follow-up visit with your provider if needed.  Visit Complete: Virtual I connected with  Daniel Brandt on 04/03/24 by a audio enabled telemedicine application and verified that I am speaking with the correct person using two identifiers.  Patient Location: Home  Provider Location: Home Office  I discussed the limitations of evaluation and management by telemedicine. The patient expressed understanding and agreed to proceed.  Vital Signs: Because this visit was a virtual/telehealth visit, some criteria may be missing or patient reported. Any vitals not documented were not able to be obtained and vitals that have been documented are patient reported.  VideoDeclined- This patient declined Librarian, academic. Therefore the visit was completed with audio only.  Persons Participating in Visit: Patient.  AWV Questionnaire: No: Patient Medicare AWV questionnaire was not completed prior to this visit.  Cardiac Risk Factors include: advanced age (>68men, >7 women);hypertension;male gender;obesity (BMI >30kg/m2);sedentary lifestyle     Objective:    Today's Vitals   04/03/24 0847  PainSc: 4    There is no height or weight on file to calculate BMI.     04/03/2024    8:57 AM 03/28/2023    2:10 PM 01/04/2021    8:36 AM 11/29/2020   11:48 AM 10/20/2019   11:22 AM 04/02/2018    2:58 PM 04/01/2017    2:04 PM  Advanced Directives  Does Patient Have a Medical Advance Directive? No No No Yes Yes Yes  Yes   Type of Theme park manager;Living will Living will;Healthcare Power of [employer] Power of [place];Living will Healthcare Power of  [place];Living will  Copy of Healthcare Power of Attorney in Chart?    No - copy requested No - copy requested No - copy requested  No - copy requested   Would patient like information on creating a medical advance directive? No - Patient declined No - Patient declined No - Patient declined         Data saved with a previous flowsheet row definition    Current Medications (verified) Outpatient Encounter Medications as of 04/03/2024  Medication Sig   albuterol  (VENTOLIN  HFA) 108 (90 Base) MCG/ACT inhaler Inhale 2 puffs into the lungs every 6 (six) hours as needed for wheezing or shortness of breath.   allopurinol  (ZYLOPRIM ) 100 MG tablet Take 1 tablet (100 mg total) by mouth daily. For gout prevention   aspirin  81 MG tablet Take 1 tablet (81 mg total) by mouth daily.   celecoxib  (CELEBREX ) 100 MG capsule Take 1 capsule (100 mg total) by mouth 2 (two) times daily as needed for moderate pain (pain score 4-6) (arthritis).   fluticasone  (FLONASE ) 50 MCG/ACT nasal spray USE 2 SPRAYS IN EACH NOSTRIL EVERY DAY FOR 4-6 WEEKS THEN STOP AND USE SEASONALLY OR AS NEEDED   levothyroxine  (SYNTHROID ) 25 MCG tablet Take 1 tablet (25 mcg total) by mouth daily before breakfast.   metoprolol  succinate (TOPROL -XL) 50 MG 24 hr tablet Take 1 tablet (50 mg total) by mouth daily. Take with or immediately following a meal.   Multiple Vitamins-Minerals (MULTIVITAMIN WITH MINERALS) tablet Take 1 tablet by mouth daily.   NON FORMULARY CPAP nightly   olmesartan  (BENICAR ) 40 MG tablet  Take 1 tablet (40 mg total) by mouth daily.   Omega-3 Fatty Acids (FISH OIL) 1000 MG CAPS Take 3 capsules by mouth 2 (two) times daily.   triamcinolone  0.1%-Cerave equivalent 1:1 cream mixture Apply topically 2 (two) times daily as needed.   vitamin B-12 (CYANOCOBALAMIN ) 500 MCG tablet Take 500 mcg by mouth daily. (Patient not taking: Reported on 04/03/2024)   No facility-administered encounter medications on file as of 04/03/2024.     Allergies (verified) Patient has no known allergies.   History: Past Medical History:  Diagnosis Date   A-fib [facility])    Arthritis    FH: heart failure 10/04/2016   Former smoker    Heart murmur    Hypertension    Malaria    Stroke (HCC)    TB lung, latent    Completed 4 months of LTBI tx with Rifampin  ~09/09/2020   Past Surgical History:  Procedure Laterality Date   COLONOSCOPY  06/12/2016   FRACTURE SURGERY     KNEE REPLACE Bilateral 2017   LUMBAR FUSION  1995   L3 L4   Family History  Problem Relation Age of Onset   Heart disease Mother    Heart disease Father    Gout Sister    Heart attack Son    Social History   Socioeconomic History   Marital status: Married    Spouse name: Edric Fetterman   Number of children: 6   Years of education: Not on file   Highest education level: Master's degree (e.g., MA, MS, MEng, MEd, MSW, MBA)  Occupational History   Occupation: Retired  Tobacco Use   Smoking status: Some Days    Current packs/day: 0.00    Average packs/day: 1 pack/day for 0.5 years (0.5 ttl pk-yrs)    Types: Cigarettes, Cigars    Start date: 04/05/1956    Last attempt to quit: 10/04/1956    Years since quitting: 67.5   Smokeless tobacco: Former   Tobacco comments:    smoking cessation materials not required        I haven't smoked cigarettes since I was 21.  I will smoke a cigar, every now and then. DJM 08/02/2023  Vaping Use   Vaping status: Never Used  Substance and Sexual Activity   Alcohol use: Yes    Alcohol/week: 28.0 standard drinks of alcohol    Types: 28 Shots of liquor per week    Comment: Scotch   Drug use: No   Sexual activity: Not Currently  Other Topics Concern   Not on file  Social History Narrative   Not on file   Social Drivers of Health   Financial Resource Strain: Low Risk  (04/03/2024)   Overall Financial Resource Strain (CARDIA)    Difficulty of Paying Living Expenses: Not hard at all  Food Insecurity: No Food  Insecurity (04/03/2024)   Hunger Vital Sign    Worried About Running Out of Food in the Last Year: Never true    Ran Out of Food in the Last Year: Never true  Transportation Needs: No Transportation Needs (04/03/2024)   PRAPARE - Administrator, Civil Service (Medical): No    Lack of Transportation (Non-Medical): No  Physical Activity: Inactive (04/03/2024)   Exercise Vital Sign    Days of Exercise per Week: 0 days    Minutes of Exercise per Session: 0 min  Stress: No Stress Concern Present (04/03/2024)   [employer] of Occupational Health - Occupational Stress Questionnaire    Feeling  of Stress: Not at all  Social Connections: Moderately Integrated (04/03/2024)   Social Connection and Isolation Panel    Frequency of Communication with Friends and Family: More than three times a week    Frequency of Social Gatherings with Friends and Family: Twice a week    Attends Religious Services: Never    Database administrator or Organizations: Yes    Attends Engineer, structural: 1 to 4 times per year    Marital Status: Married    Tobacco Counseling Ready to quit: Not Answered Counseling given: Not Answered Tobacco comments: smoking cessation materials not required  I haven't smoked cigarettes since I was 21.  I will smoke a cigar, every now and then. DJM 08/02/2023    Clinical Intake:  Pre-visit preparation completed: Yes  Pain : 0-10 Pain Score: 4  Pain Type: Chronic pain Pain Location: Finger (Comment which one) Pain Radiating Towards: ALL FINGERS- HAS ARTHRITIS Pain Descriptors / Indicators: Aching, Constant Pain Onset: More than a month ago Pain Frequency: Constant Pain Relieving Factors: CELEBREX   Pain Relieving Factors: CELEBREX   BMI - recorded: 29.9 Nutritional Status: BMI 25 -29 Overweight Nutritional Risks: None Diabetes: No  Lab Results  Component Value Date   HGBA1C 5.8 (H) 03/03/2024   HGBA1C 5.5 08/21/2023   HGBA1C 5.7 (H)  02/15/2023     How often do you need to have someone help you when you read instructions, pamphlets, or other written materials from your doctor or pharmacy?: 1 - Never  Interpreter Needed?: No  Information entered by :: JHONNIE DAS, LPN   Activities of Daily Living     04/03/2024    8:58 AM  In your present state of health, do you have any difficulty performing the following activities:  Hearing? 1  Vision? 0  Difficulty concentrating or making decisions? 1  Walking or climbing stairs? 1  Comment KNEE PAIN  Dressing or bathing? 0  Doing errands, shopping? 0  Preparing Food and eating ? N  Using the Toilet? N  In the past six months, have you accidently leaked urine? N  Do you have problems with loss of bowel control? N  Managing your Medications? Y  Managing your Finances? N  Housekeeping or managing your Housekeeping? N    Patient Care Team: Edman Marsa PARAS, DO as PCP - General (Family Medicine) Kurt Eleanor HERO, MD as Referring Physician (Gastroenterology) Brinda Greig ORN, MD as Referring Physician (Dermatology)  I have updated your Care Teams any recent Medical Services you may have received from other providers in the past year.     Assessment:   This is a routine wellness examination for Daniel Brandt.  Hearing/Vision screen Hearing Screening - Comments:: NO AIDS Vision Screening - Comments:: READERS-    Goals Addressed             This Visit's Progress    Cut out extra servings         Depression Screen     04/03/2024    8:55 AM 03/03/2024    8:05 AM 11/27/2023    2:16 PM 08/27/2023    8:05 AM 03/28/2023    2:04 PM 02/20/2023    7:54 AM 01/08/2023   10:41 AM  PHQ 2/9 Scores  PHQ - 2 Score 0 0 1 0 0 0 0  PHQ- 9 Score 0 1 2 3  0 1 2    Fall Risk     04/03/2024    8:58 AM 03/03/2024    8:05  AM 11/27/2023    2:16 PM 08/27/2023    8:04 AM 03/28/2023    2:15 PM  Fall Risk   Falls in the past year? 0 0 0 0 0  Number falls in past yr: 0    0  Injury with  Fall? 0 0   0  Risk for fall due to : No Fall Risks No Fall Risks   No Fall Risks  Follow up Falls evaluation completed;Falls prevention discussed    Falls prevention discussed;Falls evaluation completed    MEDICARE RISK AT HOME:  Medicare Risk at Home Any stairs in or around the home?: No If so, are there any without handrails?: No Home free of loose throw rugs in walkways, pet beds, electrical cords, etc?: Yes Adequate lighting in your home to reduce risk of falls?: Yes Life alert?: No Use of a cane, walker or w/c?: No Grab bars in the bathroom?: Yes Shower chair or bench in shower?: Yes Elevated toilet seat or a handicapped toilet?: Yes  TIMED UP AND GO:  Was the test performed?  No  Cognitive Function: 6CIT completed        04/03/2024    9:01 AM 03/28/2023    2:17 PM 01/11/2022    9:52 AM 11/29/2020   11:56 AM 10/20/2019   11:27 AM  6CIT Screen  What Year? 0 points 0 points 0 points 0 points 0 points  What month? 0 points 0 points 0 points 0 points 0 points  What time? 0 points 0 points 0 points 0 points 0 points  Count back from 20 0 points 0 points 0 points 0 points 0 points  Months in reverse 0 points 0 points 0 points 0 points 0 points  Repeat phrase 0 points 0 points 0 points 0 points 0 points  Total Score 0 points 0 points 0 points 0 points 0 points    Immunizations Immunization History  Administered Date(s) Administered   INFLUENZA, HIGH DOSE SEASONAL PF 04/01/2017, 04/02/2018   Influenza,inj,Quad PF,6+ Mos 03/09/2016   PFIZER Comirnaty(Gray Top)Covid-19 Tri-Sucrose Vaccine 09/06/2019   PFIZER(Purple Top)SARS-COV-2 Vaccination 09/06/2019   Pneumococcal Conjugate-13 03/09/2016   Pneumococcal Polysaccharide-23 10/09/2017   Tdap 10/04/2016   Zoster Recombinant(Shingrix) 08/20/2016, 12/21/2016    Screening Tests Health Maintenance  Topic Date Due   COVID-19 Vaccine (3 - 2025-26 season) 03/09/2024   Influenza Vaccine  10/06/2024 (Originally 02/07/2024)    Medicare Annual Wellness (AWV)  04/03/2025   DTaP/Tdap/Td (2 - Td or Tdap) 10/05/2026   Pneumococcal Vaccine: 50+ Years  Completed   Zoster Vaccines- Shingrix  Completed   HPV VACCINES  Aged Out   Meningococcal B Vaccine  Aged Out    Health Maintenance Items Addressed: DECLINES FLU SHOTS & COVID- NEEDS PNA; AGED OUT OF COLONOSCOPY  Additional Screening:  Vision Screening: Recommended annual ophthalmology exams for early detection of glaucoma and other disorders of the eye. Is the patient up to date with their annual eye exam?  Yes  Who is the provider or what is the name of the office in which the patient attends annual eye exams? [facility]  Dental Screening: Recommended annual dental exams for proper oral hygiene  Community Resource Referral / Chronic Care Management: CRR required this visit?  No   CCM required this visit?  No   Plan:    I have personally reviewed and noted the following in the patient's chart:   Medical and social history Use of alcohol, tobacco or illicit drugs  Current medications and  supplements including opioid prescriptions. Patient is not currently taking opioid prescriptions. Functional ability and status Nutritional status Physical activity Advanced directives List of other physicians Hospitalizations, surgeries, and ER visits in previous 12 months Vitals Screenings to include cognitive, depression, and falls Referrals and appointments  In addition, I have reviewed and discussed with patient certain preventive protocols, quality metrics, and best practice recommendations. A written personalized care plan for preventive services as well as general preventive health recommendations were provided to patient.   Jhonnie GORMAN Das, LPN   [id number]   After Visit Summary: (MyChart) Due to this being a telephonic visit, the after visit summary with patients personalized plan was offered to patient via MyChart   Notes: Nothing significant to report  at this time.

## 2024-04-03 NOTE — Patient Instructions (Addendum)
 [name],  Thank you for taking the time for your Medicare Wellness Visit. I appreciate your continued commitment to your health goals. Please review the care plan we discussed, and feel free to reach out if I can assist you further.  Medicare recommends these wellness visits once per year to help you and your care team stay ahead of potential health issues. These visits are designed to focus on prevention, allowing your provider to concentrate on managing your acute and chronic conditions during your regular appointments.  Please note that Annual Wellness Visits do not include a physical exam. Some assessments may be limited, especially if the visit was conducted virtually. If needed, we may recommend a separate in-person follow-up with your provider.  Ongoing Care Seeing your primary care provider every 3 to 6 months helps us  monitor your health and provide consistent, personalized care.   Referrals If a referral was made during today's visit and you haven't received any updates within two weeks, please contact the referred provider directly to check on the status.  Recommended Screenings:  Health Maintenance  Topic Date Due   COVID-19 Vaccine (3 - 2025-26 season) 03/09/2024   Flu Shot  10/06/2024*   Medicare Annual Wellness Visit  04/03/2025   DTaP/Tdap/Td vaccine (2 - Td or Tdap) 10/05/2026   Pneumococcal Vaccine for age over 54  Completed   Zoster (Shingles) Vaccine  Completed   HPV Vaccine  Aged Out   Meningitis B Vaccine  Aged Out  *Topic was postponed. The date shown is not the original due date.     Advance Care Planning is important because it: Ensures you receive medical care that aligns with your values, goals, and preferences. Provides guidance to your family and loved ones, reducing the emotional burden of decision-making during critical moments.  Vision: Annual vision screenings are recommended for early detection of glaucoma, cataracts, and diabetic retinopathy.  These exams can also reveal signs of chronic conditions such as diabetes and high blood pressure.  Dental: Annual dental screenings help detect early signs of oral cancer, gum disease, and other conditions linked to overall health, including heart disease and diabetes.  Please see the attached documents for additional preventive care recommendations.   NEXT AWV 04/09/25 @ 8:10 AM BY PHONE

## 2024-05-18 ENCOUNTER — Encounter: Payer: Self-pay | Admitting: Family Medicine

## 2024-05-18 ENCOUNTER — Ambulatory Visit: Admitting: Family Medicine

## 2024-05-18 VITALS — BP 132/68 | HR 60 | Ht 76.0 in | Wt 247.2 lb

## 2024-05-18 DIAGNOSIS — R5383 Other fatigue: Secondary | ICD-10-CM

## 2024-05-18 DIAGNOSIS — E039 Hypothyroidism, unspecified: Secondary | ICD-10-CM

## 2024-05-18 DIAGNOSIS — E538 Deficiency of other specified B group vitamins: Secondary | ICD-10-CM

## 2024-05-18 DIAGNOSIS — G4733 Obstructive sleep apnea (adult) (pediatric): Secondary | ICD-10-CM

## 2024-05-18 DIAGNOSIS — I10 Essential (primary) hypertension: Secondary | ICD-10-CM | POA: Diagnosis not present

## 2024-05-18 DIAGNOSIS — R7309 Other abnormal glucose: Secondary | ICD-10-CM

## 2024-05-18 DIAGNOSIS — R110 Nausea: Secondary | ICD-10-CM

## 2024-05-18 DIAGNOSIS — M1A0710 Idiopathic chronic gout, right ankle and foot, without tophus (tophi): Secondary | ICD-10-CM

## 2024-05-18 NOTE — Patient Instructions (Addendum)
 Thank you for coming to the office today.  Suspected side effect Celebrex  can cause the nausea and reduced appetite digestive side effects  Okay to still take Celebrex  always take with food and use with caution or sparingly  Weight has been stable, 247 lbs  Possibly reduced energy with lack of CPAP lately, hopefully [employer] can fix this and set some alarm.  Most symptoms are linked to pain and arthritis, and energy levels reducing  Last year we checked B12 and Vitamin D  and they were normal  I do think the thyroid  dose is appropriate  Possible rash with heat rash or reaction to inflammation.   Please schedule a Follow-up Appointment to: Return in about 4 months (around 09/15/2024) for 4 month lab then 1 week later Follow-up Lab results Gout, Thyroid .  If you have any other questions or concerns, please feel free to call the office or send a message through MyChart. You may also schedule an earlier appointment if necessary.  Additionally, you may be receiving a survey about your experience at our office within a few days to 1 week by e-mail or mail. We value your feedback.  Marsa Officer, DO [facility], [place]

## 2024-05-18 NOTE — Progress Notes (Signed)
 Subjective:    Patient ID: Daniel Brandt, male    DOB: [birth year], 89 y.o.   MRN: [id number]  Daniel Brandt is a 89 y.o. male presenting on 05/18/2024 for Medical Management of Chronic Issues   HPI  Discussed the use of AI scribe software for clinical note transcription with the patient, who gave verbal consent to proceed.  History of Present Illness   Daniel Brandt is an 89 year old male who presents with appetite loss and rash.  Appetite loss and weight reduction - Decreased appetite since May 2025 - Weight decreased from 257 pounds in May to 247 pounds currently - Queasy sensation without vomiting - Generalized lack of energy and easy fatigability after minimal exertion, worsened following issue with CPAP machine no longer able to use  Nausea - Queasy sensation without emesis - Suspects NSAID vs gout medication as a possible contributor to nausea - Recent stomach upset after taking Celebrex   OSA on CPAP Sleep disturbance due to cpap malfunction - CPAP machine stopped air flow without alarm nearly one week ago, causing sensation of suffocation - Discontinued CPAP use since malfunction - Awaiting feedback from service provider [employer] after submitting machine for inspection - Impaired sleep quality since discontinuing CPAP, seems this is related to reduced energy fatigue  Gout and arthralgia - History of gout with concern for uric acid levels, especially in right foot second toe and finger joints - No recent gout flares - Diligent with gout medication, no significant change in symptoms - Arthritis pain, especially in fingers - Used Celebrex  approximately five times in the last ten days for pain management  Cutaneous eruption - Recent episode of blotchy skin on hands and legs - Rash was non-pruritic and resolved without treatment - Improved with Vaseline application           04/03/2024    8:55 AM 03/03/2024    8:05 AM 11/27/2023    2:16 PM  Depression  screen PHQ 2/9  Decreased Interest 0 0 1  Down, Depressed, Hopeless 0 0 0  PHQ - 2 Score 0 0 1  Altered sleeping 0 0 0  Tired, decreased energy 0 1 1  Change in appetite 0 0 0  Feeling bad or failure about yourself  0 0 0  Trouble concentrating 0 0 0  Moving slowly or fidgety/restless 0 0 0  Suicidal thoughts 0 0 0  PHQ-9 Score 0  1  2   Difficult doing work/chores Not difficult at all Not difficult at all Somewhat difficult     Data saved with a previous flowsheet row definition       03/03/2024    8:05 AM 11/27/2023    2:16 PM 08/27/2023    8:05 AM 02/20/2023    8:00 AM  GAD 7 : Generalized Anxiety Score  Nervous, Anxious, on Edge 0 0 0 0  Control/stop worrying 0 0 0 0  Worry too much - different things 0 0 0 0  Trouble relaxing 0 0 0 0  Restless 0 0 0 0  Easily annoyed or irritable 0 1 0 0  Afraid - awful might happen 0 0 0 0  Total GAD 7 Score 0 1 0 0  Anxiety Difficulty  Not difficult at all  Not difficult at all    Social History   Tobacco Use   Smoking status: Some Days    Current packs/day: 0.00    Average packs/day: 1 pack/day for 0.5 years (0.5 ttl pk-yrs)  Types: Cigarettes, Cigars    Start date: 04/05/1956    Last attempt to quit: 10/04/1956    Years since quitting: 67.6   Smokeless tobacco: Former   Tobacco comments:    smoking cessation materials not required        I haven't smoked cigarettes since I was 21.  I will smoke a cigar, every now and then. DJM 08/02/2023  Vaping Use   Vaping status: Never Used  Substance Use Topics   Alcohol use: Yes    Alcohol/week: 28.0 standard drinks of alcohol    Types: 28 Shots of liquor per week    Comment: Scotch   Drug use: No    Review of Systems Per HPI unless specifically indicated above     Objective:    BP 132/68 (BP Location: Right Arm, Patient Position: Sitting, Cuff Size: Normal)   Pulse 60   Ht 6' 4 (1.93 m)   Wt 247 lb 4 oz (112.2 kg)   SpO2 97%   BMI 30.10 kg/m   Wt Readings from  Last 3 Encounters:  05/18/24 247 lb 4 oz (112.2 kg)  03/03/24 246 lb 6 oz (111.8 kg)  11/27/23 257 lb (116.6 kg)    Physical Exam Vitals and nursing note reviewed.  Constitutional:      General: He is not in acute distress.    Appearance: Normal appearance. He is well-developed. He is not diaphoretic.     Comments: Well-appearing, comfortable, cooperative  HENT:     Head: Normocephalic and atraumatic.  Eyes:     General:        Right eye: No discharge.        Left eye: No discharge.     Conjunctiva/sclera: Conjunctivae normal.  Cardiovascular:     Rate and Rhythm: Normal rate.  Pulmonary:     Effort: Pulmonary effort is normal.  Skin:    General: Skin is warm and dry.     Findings: No erythema or rash.  Neurological:     Mental Status: He is alert and oriented to person, place, and time.  Psychiatric:        Mood and Affect: Mood normal.        Behavior: Behavior normal.        Thought Content: Thought content normal.     Comments: Well groomed, good eye contact, normal speech and thoughts     Results for orders placed or performed in visit on 03/03/24  Uric acid   Collection Time: 03/03/24  8:44 AM  Result Value Ref Range   Uric Acid, Serum 8.3 (H) 4.0 - 8.0 mg/dL  T4, free   Collection Time: 03/03/24  8:44 AM  Result Value Ref Range   Free T4 1.2 0.8 - 1.8 ng/dL  PSA   Collection Time: 03/03/24  8:44 AM  Result Value Ref Range   PSA 0.90 < OR = 4.00 ng/mL  CBC with Differential/Platelet   Collection Time: 03/03/24  8:44 AM  Result Value Ref Range   WBC 4.1 3.8 - 10.8 Thousand/uL   RBC 4.06 (L) 4.20 - 5.80 Million/uL   Hemoglobin 14.6 13.2 - 17.1 g/dL   HCT 56.4 61.4 - 49.9 %   MCV 107.1 (H) 80.0 - 100.0 fL   MCH 36.0 (H) 27.0 - 33.0 pg   MCHC 33.6 32.0 - 36.0 g/dL   RDW 87.6 88.9 - 84.9 %   Platelets 232 140 - 400 Thousand/uL   MPV 9.0 7.5 - 12.5 fL  Neutro Abs 2,095 1,500 - 7,800 cells/uL   Absolute Lymphocytes 1,279 850 - 3,900 cells/uL   Absolute  Monocytes 426 200 - 950 cells/uL   Eosinophils Absolute 279 15 - 500 cells/uL   Basophils Absolute 21 0 - 200 cells/uL   Neutrophils Relative % 51.1 %   Total Lymphocyte 31.2 %   Monocytes Relative 10.4 %   Eosinophils Relative 6.8 %   Basophils Relative 0.5 %  COMPLETE METABOLIC PANEL WITH GFR   Collection Time: 03/03/24  8:44 AM  Result Value Ref Range   Glucose, Bld 91 65 - 99 mg/dL   BUN 15 7 - 25 mg/dL   Creat 8.83 9.29 - 8.77 mg/dL   BUN/Creatinine Ratio SEE NOTE: 6 - 22 (calc)   Sodium 137 135 - 146 mmol/L   Potassium 4.7 3.5 - 5.3 mmol/L   Chloride 102 98 - 110 mmol/L   CO2 25 20 - 32 mmol/L   Calcium 9.3 8.6 - 10.3 mg/dL   Total Protein 6.4 6.1 - 8.1 g/dL   Albumin 4.4 3.6 - 5.1 g/dL   Globulin 2.0 1.9 - 3.7 g/dL (calc)   AG Ratio 2.2 1.0 - 2.5 (calc)   Total Bilirubin 0.8 0.2 - 1.2 mg/dL   Alkaline phosphatase (APISO) 47 35 - 144 U/L   AST 37 (H) 10 - 35 U/L   ALT 51 (H) 9 - 46 U/L  Hemoglobin A1c   Collection Time: 03/03/24  8:44 AM  Result Value Ref Range   Hgb A1c MFr Bld 5.8 (H) <5.7 %   Mean Plasma Glucose 120 mg/dL   eAG (mmol/L) 6.6 mmol/L  Lipid panel   Collection Time: 03/03/24  8:44 AM  Result Value Ref Range   Cholesterol 154 <200 mg/dL   HDL 43 > OR = 40 mg/dL   Triglycerides 805 (H) <150 mg/dL   LDL Cholesterol (Calc) 82 mg/dL (calc)   Total CHOL/HDL Ratio 3.6 <5.0 (calc)   Non-HDL Cholesterol (Calc) 111 <130 mg/dL (calc)  TSH   Collection Time: 03/03/24  8:44 AM  Result Value Ref Range   TSH 4.90 (H) 0.40 - 4.50 mIU/L  TEST AUTHORIZATION   Collection Time: 03/03/24  8:44 AM  Result Value Ref Range   TEST NAME: TSH    TEST CODE: [id number]    CLIENT CONTACT: ANGIE DICKENS    REPORT ALWAYS MESSAGE SIGNATURE        Assessment & Plan:   Problem List Items Addressed This Visit     OSA on CPAP - Primary   Relevant Orders   Comprehensive metabolic panel with GFR   Other Visit Diagnoses       Fatigue, unspecified type       Relevant  Orders   T4, free   TSH   Comprehensive metabolic panel with GFR   Vitamin B12     Essential hypertension       Relevant Orders   Comprehensive metabolic panel with GFR     Acquired hypothyroidism       Relevant Orders   T4, free   TSH     Nausea         Vitamin B12 deficiency       Relevant Orders   Vitamin B12     Abnormal glucose       Relevant Orders   Hemoglobin A1c     Chronic gout of right foot, unspecified cause       Relevant Orders   Uric acid  Fatigue and decreased energy with associated nausea Chronic fatigue and nausea possibly due to Celebrex  side effects and CPAP malfunction. Thyroid  function managed, no significant weight loss, appetite decreased. - Monitor symptoms, consider reducing Celebrex . - Repair or replace CPAP machine. - Follow-up in four months with thyroid  and gout blood tests.  Obstructive sleep apnea with CPAP malfunction CPAP malfunction led to near suffocation. Machine under evaluation. Concern about lack of alarm system. - Await CPAP repair. - Ensure machine has alarm system.  Gout Managed with medication to prevent flares. No current pain or flares. Chronic deformity Right Foot Toes Prior Uric Acid 8.3, down from 10 - Continue current medication. Allopurinol  100mg  dailiy for prevention - Check uric acid levels at next follow-up.  Hypothyroidism Managed with low-dose thyroid  medication. Dose sufficient for energy and metabolism support based on labs. - Continue current dose. Levothyroxine  25mcg daily - Check thyroid  levels at next follow-up.  Osteoarthritis Significant pain in fingers managed with Celebrex , aware of gastrointestinal side effects. - Use Celebrex  sparingly with food for significant pain.        Orders Placed This Encounter  Procedures   T4, free    Standing Status:   Future    Expected Date:   09/16/2024    Expiration Date:   12/15/2024   TSH    Standing Status:   Future    Expected Date:   09/16/2024     Expiration Date:   12/15/2024   Hemoglobin A1c    Standing Status:   Future    Expected Date:   09/16/2024    Expiration Date:   12/15/2024   Comprehensive metabolic panel with GFR    Standing Status:   Future    Expected Date:   09/16/2024    Expiration Date:   12/15/2024   Uric acid    Standing Status:   Future    Expected Date:   09/16/2024    Expiration Date:   12/15/2024   Vitamin B12    Standing Status:   Future    Expected Date:   09/16/2024    Expiration Date:   12/15/2024    No orders of the defined types were placed in this encounter.   Follow up plan: Return in about 4 months (around 09/15/2024) for 4 month lab then 1 week later Follow-up Lab results Gout, Thyroid .  Future labs ordered for 09/16/24   Marsa Officer, DO [facility] 05/18/2024, 11:13 AM

## 2024-06-27 ENCOUNTER — Other Ambulatory Visit: Payer: Self-pay | Admitting: Family Medicine

## 2024-06-27 DIAGNOSIS — M1A0710 Idiopathic chronic gout, right ankle and foot, without tophus (tophi): Secondary | ICD-10-CM

## 2024-06-30 NOTE — Telephone Encounter (Signed)
 Requested Prescriptions  Pending Prescriptions Disp Refills   allopurinol  (ZYLOPRIM ) 100 MG tablet [Pharmacy Med Name: ALLOPURINOL  100MG  TABLETS] 30 tablet 2    Sig: TAKE 1 TABLET(100 MG) BY MOUTH DAILY FOR GOUT PREVENTION     Endocrinology:  Gout Agents - allopurinol  Failed - 06/30/2024  4:21 PM      Failed - Uric Acid in normal range and within 360 days    Uric Acid, Serum  Date Value Ref Range Status  03/03/2024 8.3 (H) 4.0 - 8.0 mg/dL Final    Comment:    Therapeutic target for gout patients: <6.0 mg/dL .          Passed - Cr in normal range and within 360 days    Creat  Date Value Ref Range Status  03/03/2024 1.16 0.70 - 1.22 mg/dL Final         Passed - Valid encounter within last 12 months    Recent Outpatient Visits           1 month ago OSA on CPAP   [place] [facility] [place], Marsa PARAS, DO   3 months ago Annual physical exam   [place] [facility] [place] Marsa PARAS, DO   7 months ago OSA (obstructive sleep apnea)   [place] [facility] [place] Marsa PARAS, DO   9 months ago Acute non-recurrent frontal sinusitis   [place] [facility] [place] Marsa PARAS, DO   9 months ago Acute non-recurrent frontal sinusitis    [facility] [place], Marsa PARAS, DO              Passed - CBC within normal limits and completed in the last 12 months    WBC  Date Value Ref Range Status  03/03/2024 4.1 3.8 - 10.8 Thousand/uL Final   RBC  Date Value Ref Range Status  03/03/2024 4.06 (L) 4.20 - 5.80 Million/uL Final   Hemoglobin  Date Value Ref Range Status  03/03/2024 14.6 13.2 - 17.1 g/dL Final  88/98/7978 86.5 13.0 - 17.7 g/dL Final   HCT  Date Value Ref Range Status  03/03/2024 43.5 38.5 - 50.0 % Final   Hematocrit  Date Value Ref Range Status  05/09/2020 38.8 37.5 - 51.0 % Final   MCHC  Date Value Ref Range Status  03/03/2024  33.6 32.0 - 36.0 g/dL Final    Comment:    For adults, a slight decrease in the calculated MCHC value (in the range of 30 to 32 g/dL) is most likely not clinically significant; however, it should be interpreted with caution in correlation with other red cell parameters and the patient's clinical condition.    [facility]  Date Value Ref Range Status  03/03/2024 36.0 (H) 27.0 - 33.0 pg Final   MCV  Date Value Ref Range Status  03/03/2024 107.1 (H) 80.0 - 100.0 fL Final  05/09/2020 104 (H) 79 - 97 fL Final   No results found for: PLTCOUNTKUC, LABPLAT, POCPLA RDW  Date Value Ref Range Status  03/03/2024 12.3 11.0 - 15.0 % Final  05/09/2020 12.6 11.6 - 15.4 % Final

## 2024-09-16 ENCOUNTER — Other Ambulatory Visit

## 2024-09-23 ENCOUNTER — Ambulatory Visit: Admitting: Family Medicine

## 2025-03-04 ENCOUNTER — Encounter: Admitting: Family Medicine

## 2025-04-09 ENCOUNTER — Ambulatory Visit
